# Patient Record
Sex: Male | Born: 1960 | Race: White | Hispanic: No | Smoking: Never smoker
Health system: Southern US, Community
[De-identification: ages and names within clinical notes are randomized; demographics above are authoritative.]

## PROBLEM LIST (undated history)

## (undated) DIAGNOSIS — K859 Acute pancreatitis without necrosis or infection, unspecified: Secondary | ICD-10-CM

## (undated) DIAGNOSIS — E119 Type 2 diabetes mellitus without complications: Secondary | ICD-10-CM

## (undated) DIAGNOSIS — I10 Essential (primary) hypertension: Secondary | ICD-10-CM

---

## 2014-06-25 ENCOUNTER — Inpatient Hospital Stay (HOSPITAL_COMMUNITY)
Admission: EM | Admit: 2014-06-25 | Discharge: 2014-07-01 | DRG: 442 | Disposition: A | Discharge status: Home / Self Care | Payer: BLUE CROSS/BLUE SHIELD | Attending: Family Medicine | Admitting: Family Medicine

## 2014-06-25 ENCOUNTER — Encounter (HOSPITAL_COMMUNITY): Payer: Self-pay | Admitting: *Deleted

## 2014-06-25 DIAGNOSIS — K7010 Alcoholic hepatitis without ascites: Secondary | ICD-10-CM | POA: Diagnosis present

## 2014-06-25 DIAGNOSIS — K7290 Hepatic failure, unspecified without coma: Principal | ICD-10-CM | POA: Diagnosis present

## 2014-06-25 DIAGNOSIS — R279 Unspecified lack of coordination: Secondary | ICD-10-CM

## 2014-06-25 DIAGNOSIS — R7401 Elevation of levels of liver transaminase levels: Secondary | ICD-10-CM

## 2014-06-25 DIAGNOSIS — R740 Nonspecific elevation of levels of transaminase and lactic acid dehydrogenase [LDH]: Secondary | ICD-10-CM | POA: Diagnosis present

## 2014-06-25 DIAGNOSIS — E876 Hypokalemia: Secondary | ICD-10-CM | POA: Diagnosis present

## 2014-06-25 DIAGNOSIS — K7682 Hepatic encephalopathy: Secondary | ICD-10-CM | POA: Diagnosis present

## 2014-06-25 DIAGNOSIS — Z8719 Personal history of other diseases of the digestive system: Secondary | ICD-10-CM | POA: Diagnosis not present

## 2014-06-25 DIAGNOSIS — R270 Ataxia, unspecified: Secondary | ICD-10-CM

## 2014-06-25 DIAGNOSIS — F1010 Alcohol abuse, uncomplicated: Secondary | ICD-10-CM | POA: Diagnosis not present

## 2014-06-25 DIAGNOSIS — E119 Type 2 diabetes mellitus without complications: Secondary | ICD-10-CM | POA: Diagnosis present

## 2014-06-25 DIAGNOSIS — F10231 Alcohol dependence with withdrawal delirium: Secondary | ICD-10-CM | POA: Diagnosis present

## 2014-06-25 DIAGNOSIS — D696 Thrombocytopenia, unspecified: Secondary | ICD-10-CM | POA: Diagnosis present

## 2014-06-25 DIAGNOSIS — K709 Alcoholic liver disease, unspecified: Secondary | ICD-10-CM

## 2014-06-25 DIAGNOSIS — R251 Tremor, unspecified: Secondary | ICD-10-CM

## 2014-06-25 DIAGNOSIS — R7989 Other specified abnormal findings of blood chemistry: Secondary | ICD-10-CM

## 2014-06-25 DIAGNOSIS — F10239 Alcohol dependence with withdrawal, unspecified: Secondary | ICD-10-CM

## 2014-06-25 DIAGNOSIS — R945 Abnormal results of liver function studies: Secondary | ICD-10-CM

## 2014-06-25 DIAGNOSIS — R42 Dizziness and giddiness: Secondary | ICD-10-CM | POA: Diagnosis present

## 2014-06-25 DIAGNOSIS — F10939 Alcohol use, unspecified with withdrawal, unspecified: Secondary | ICD-10-CM

## 2014-06-25 HISTORY — DX: Acute pancreatitis without necrosis or infection, unspecified: K85.90

## 2014-06-25 HISTORY — DX: Essential (primary) hypertension: I10

## 2014-06-25 HISTORY — DX: Type 2 diabetes mellitus without complications: E11.9

## 2014-06-25 LAB — COMPREHENSIVE METABOLIC PANEL
ALK PHOS: 289 U/L — AB (ref 39–117)
ALT: 157 U/L — ABNORMAL HIGH (ref 0–53)
ANION GAP: 11 (ref 5–15)
AST: 484 U/L — ABNORMAL HIGH (ref 0–37)
Albumin: 3.9 g/dL (ref 3.5–5.2)
BUN: 7 mg/dL (ref 6–23)
CALCIUM: 8.3 mg/dL — AB (ref 8.4–10.5)
CO2: 23 mmol/L (ref 19–32)
Chloride: 102 mmol/L (ref 96–112)
Creatinine, Ser: 0.57 mg/dL (ref 0.50–1.35)
GFR calc non Af Amer: >90 mL/min (ref >90)
Glucose, Bld: 119 mg/dL — ABNORMAL HIGH (ref 70–99)
POTASSIUM: 3.6 mmol/L (ref 3.5–5.1)
SODIUM: 136 mmol/L (ref 135–145)
Total Bilirubin: 4.1 mg/dL — ABNORMAL HIGH (ref 0.3–1.2)
Total Protein: 7.2 g/dL (ref 6.0–8.3)

## 2014-06-25 LAB — CBC
HEMATOCRIT: 38.7 % — AB (ref 39.0–52.0)
Hemoglobin: 12.4 g/dL — ABNORMAL LOW (ref 13.0–17.0)
MCH: 29 pg (ref 26.0–34.0)
MCHC: 32 g/dL (ref 30.0–36.0)
MCV: 90.6 fL (ref 78.0–100.0)
PLATELETS: 60 10*3/uL — AB (ref 150–400)
RBC: 4.27 MIL/uL (ref 4.22–5.81)
RDW: 20.2 % — ABNORMAL HIGH (ref 11.5–15.5)
WBC: 4.2 10*3/uL (ref 4.0–10.5)

## 2014-06-25 LAB — ETHANOL: Alcohol, Ethyl (B): 17 mg/dL — ABNORMAL HIGH (ref 0–9)

## 2014-06-25 LAB — GLUCOSE, CAPILLARY
GLUCOSE-CAPILLARY: 133 mg/dL — AB (ref 70–99)
Glucose-Capillary: 146 mg/dL — ABNORMAL HIGH (ref 70–99)

## 2014-06-25 LAB — AMMONIA: Ammonia: 76 umol/L — ABNORMAL HIGH (ref 11–32)

## 2014-06-25 LAB — RAPID URINE DRUG SCREEN, HOSP PERFORMED
Amphetamines: NOT DETECTED
BENZODIAZEPINES: POSITIVE — AB
Barbiturates: NOT DETECTED
Cocaine: NOT DETECTED
Opiates: NOT DETECTED
Tetrahydrocannabinol: NOT DETECTED

## 2014-06-25 MED ORDER — SODIUM CHLORIDE 0.9 % IV BOLUS (SEPSIS)
1000.0000 mL | Freq: Once | INTRAVENOUS | Status: AC
  Administered 2014-06-25: 1000 mL via INTRAVENOUS

## 2014-06-25 MED ORDER — LORAZEPAM 2 MG/ML IJ SOLN
1.0000 mg | Freq: Once | INTRAMUSCULAR | Status: AC
  Administered 2014-06-25: 1 mg via INTRAVENOUS
  Filled 2014-06-25: qty 1

## 2014-06-25 MED ORDER — INSULIN GLARGINE 100 UNIT/ML ~~LOC~~ SOLN
16.0000 [IU] | Freq: Every day | SUBCUTANEOUS | Status: DC
  Administered 2014-06-26 – 2014-07-01 (×6): 16 [IU] via SUBCUTANEOUS
  Filled 2014-06-25 (×6): qty 0.16

## 2014-06-25 MED ORDER — LORAZEPAM 1 MG PO TABS
0.0000 mg | ORAL_TABLET | Freq: Two times a day (BID) | ORAL | Status: DC
  Filled 2014-06-25: qty 2

## 2014-06-25 MED ORDER — RIFAXIMIN 550 MG PO TABS
550.0000 mg | ORAL_TABLET | Freq: Two times a day (BID) | ORAL | Status: DC
  Administered 2014-06-25 – 2014-07-01 (×12): 550 mg via ORAL
  Filled 2014-06-25 (×13): qty 1

## 2014-06-25 MED ORDER — LORAZEPAM 2 MG/ML IJ SOLN: 0.0000 mg | Freq: Four times a day (QID) | INTRAMUSCULAR | Status: DC

## 2014-06-25 MED ORDER — LORAZEPAM 1 MG PO TABS
1.0000 mg | ORAL_TABLET | Freq: Four times a day (QID) | ORAL | Status: AC | PRN
  Administered 2014-06-25 – 2014-06-26 (×3): 1 mg via ORAL
  Filled 2014-06-25 (×6): qty 1

## 2014-06-25 MED ORDER — LORAZEPAM 2 MG/ML IJ SOLN
1.0000 mg | Freq: Four times a day (QID) | INTRAMUSCULAR | Status: AC | PRN
  Administered 2014-06-27: 1 mg via INTRAVENOUS
  Filled 2014-06-25: qty 1

## 2014-06-25 MED ORDER — FLUOXETINE HCL 20 MG PO CAPS
20.0000 mg | ORAL_CAPSULE | Freq: Every day | ORAL | Status: DC
  Administered 2014-06-25 – 2014-07-01 (×7): 20 mg via ORAL
  Filled 2014-06-25 (×7): qty 1

## 2014-06-25 MED ORDER — ACETAMINOPHEN 650 MG RE SUPP: 650.0000 mg | Freq: Four times a day (QID) | RECTAL | Status: DC | PRN

## 2014-06-25 MED ORDER — METOPROLOL SUCCINATE ER 50 MG PO TB24
50.0000 mg | ORAL_TABLET | Freq: Every day | ORAL | Status: DC
  Administered 2014-06-25 – 2014-07-01 (×7): 50 mg via ORAL
  Filled 2014-06-25 (×7): qty 1

## 2014-06-25 MED ORDER — ADULT MULTIVITAMIN W/MINERALS CH
1.0000 | ORAL_TABLET | Freq: Every day | ORAL | Status: DC
  Administered 2014-06-25 – 2014-07-01 (×6): 1 via ORAL
  Filled 2014-06-25 (×7): qty 1

## 2014-06-25 MED ORDER — ONDANSETRON HCL 4 MG/2ML IJ SOLN: 4.0000 mg | Freq: Four times a day (QID) | INTRAMUSCULAR | Status: DC | PRN

## 2014-06-25 MED ORDER — LACTULOSE 10 GM/15ML PO SOLN
30.0000 g | Freq: Once | ORAL | Status: AC
  Administered 2014-06-25: 30 g via ORAL
  Filled 2014-06-25: qty 45

## 2014-06-25 MED ORDER — SODIUM CHLORIDE 0.9 % IV SOLN
INTRAVENOUS | Status: DC
  Administered 2014-06-25: 19:00:00 via INTRAVENOUS

## 2014-06-25 MED ORDER — LORAZEPAM 2 MG/ML IJ SOLN
2.0000 mg | Freq: Once | INTRAMUSCULAR | Status: AC
  Administered 2014-06-25: 2 mg via INTRAVENOUS
  Filled 2014-06-25: qty 1

## 2014-06-25 MED ORDER — PANCRELIPASE (LIP-PROT-AMYL) 12000-38000 UNITS PO CPEP
24000.0000 [IU] | ORAL_CAPSULE | Freq: Three times a day (TID) | ORAL | Status: DC
  Administered 2014-06-25 – 2014-07-01 (×16): 24000 [IU] via ORAL
  Filled 2014-06-25 (×20): qty 2

## 2014-06-25 MED ORDER — VITAMIN B-1 100 MG PO TABS
100.0000 mg | ORAL_TABLET | Freq: Every day | ORAL | Status: DC
  Administered 2014-06-25: 100 mg via ORAL
  Filled 2014-06-25: qty 1

## 2014-06-25 MED ORDER — PANTOPRAZOLE SODIUM 40 MG PO TBEC
40.0000 mg | DELAYED_RELEASE_TABLET | Freq: Two times a day (BID) | ORAL | Status: DC
  Administered 2014-06-25 – 2014-07-01 (×12): 40 mg via ORAL
  Filled 2014-06-25 (×13): qty 1

## 2014-06-25 MED ORDER — INSULIN GLARGINE 100 UNIT/ML SOLOSTAR PEN: 16.0000 [IU] | PEN_INJECTOR | Freq: Every day | SUBCUTANEOUS | Status: DC

## 2014-06-25 MED ORDER — FOLIC ACID 1 MG PO TABS
1.0000 mg | ORAL_TABLET | Freq: Every day | ORAL | Status: DC
  Administered 2014-06-25 – 2014-07-01 (×7): 1 mg via ORAL
  Filled 2014-06-25 (×7): qty 1

## 2014-06-25 MED ORDER — VITAMIN B-1 100 MG PO TABS
100.0000 mg | ORAL_TABLET | Freq: Every day | ORAL | Status: DC
  Administered 2014-06-25 – 2014-06-30 (×6): 100 mg via ORAL
  Filled 2014-06-25 (×7): qty 1

## 2014-06-25 MED ORDER — GABAPENTIN 300 MG PO CAPS
300.0000 mg | ORAL_CAPSULE | Freq: Every day | ORAL | Status: DC
  Administered 2014-06-25 – 2014-06-30 (×6): 300 mg via ORAL
  Filled 2014-06-25 (×7): qty 1

## 2014-06-25 MED ORDER — LORAZEPAM 1 MG PO TABS
0.0000 mg | ORAL_TABLET | Freq: Four times a day (QID) | ORAL | Status: DC
  Administered 2014-06-25: 2 mg via ORAL

## 2014-06-25 MED ORDER — ONDANSETRON HCL 4 MG PO TABS: 4.0000 mg | ORAL_TABLET | Freq: Four times a day (QID) | ORAL | Status: DC | PRN

## 2014-06-25 MED ORDER — ENOXAPARIN SODIUM 40 MG/0.4ML ~~LOC~~ SOLN
40.0000 mg | SUBCUTANEOUS | Status: DC
  Administered 2014-06-25 – 2014-06-26 (×2): 40 mg via SUBCUTANEOUS
  Filled 2014-06-25 (×3): qty 0.4

## 2014-06-25 MED ORDER — LORAZEPAM 2 MG/ML IJ SOLN: 0.0000 mg | Freq: Two times a day (BID) | INTRAMUSCULAR | Status: DC

## 2014-06-25 MED ORDER — LACTULOSE 10 GM/15ML PO SOLN
20.0000 g | Freq: Three times a day (TID) | ORAL | Status: DC
  Administered 2014-06-25 (×2): 20 g via ORAL
  Filled 2014-06-25 (×5): qty 30

## 2014-06-25 MED ORDER — INSULIN ASPART 100 UNIT/ML ~~LOC~~ SOLN
0.0000 [IU] | Freq: Three times a day (TID) | SUBCUTANEOUS | Status: DC
  Administered 2014-06-25 – 2014-06-26 (×2): 1 [IU] via SUBCUTANEOUS
  Administered 2014-06-27: 2 [IU] via SUBCUTANEOUS
  Administered 2014-06-27 – 2014-06-28 (×3): 1 [IU] via SUBCUTANEOUS
  Administered 2014-06-28: 3 [IU] via SUBCUTANEOUS
  Administered 2014-06-29 (×2): 2 [IU] via SUBCUTANEOUS
  Administered 2014-06-29: 3 [IU] via SUBCUTANEOUS
  Administered 2014-06-30 – 2014-07-01 (×4): 2 [IU] via SUBCUTANEOUS

## 2014-06-25 MED ORDER — THIAMINE HCL 100 MG/ML IJ SOLN
100.0000 mg | Freq: Every day | INTRAMUSCULAR | Status: DC
  Administered 2014-07-01: 100 mg via INTRAVENOUS
  Filled 2014-06-25 (×7): qty 1

## 2014-06-25 MED ORDER — ACETAMINOPHEN 325 MG PO TABS
650.0000 mg | ORAL_TABLET | Freq: Four times a day (QID) | ORAL | Status: DC | PRN
Start: 2014-06-25 — End: 2014-07-01

## 2014-06-25 NOTE — ED Notes (Signed)
Awake. Verbally responsive. A/O x4. Resp even and unlabored. No audible adventitious breath sounds noted. ABC's intact. Pt continues to have uncontrollable shaking of ext. Pt attempted to ambulate to BR with Dr. Denton LankSteinl but gait was very unsteady.

## 2014-06-25 NOTE — ED Notes (Signed)
Patient is unsteady, 2 person assist.

## 2014-06-25 NOTE — ED Notes (Signed)
Patient is unable to ambulate at this time, I will check on him in a few to try again

## 2014-06-25 NOTE — ED Notes (Signed)
Awake. Verbally responsive. A/O x4. Resp even and unlabored. No audible adventitious breath sounds noted. ABC's intact. IV patent and intact. Pt continues to have uncontrollable shaking of ext and trembling speech. 

## 2014-06-25 NOTE — ED Notes (Signed)
Patient given urinal.

## 2014-06-25 NOTE — ED Notes (Signed)
Pt unable to ambulate d/t unsteady gait. Pt transferred x2 assisted and in wc to BR.

## 2014-06-25 NOTE — ED Notes (Addendum)
Awake. Verbally responsive. A/O x4. Resp even and unlabored. No audible adventitious breath sounds noted. ABC's intact. Denies pain. Pt reported detoxing from alcohol. Last intake of wine was yesterday. Pt noted having visual tremors and upper ext and trembling in voice. Pt sent from [facility].

## 2014-06-25 NOTE — ED Notes (Signed)
Per EMS - patient comes from [facility] with believed DT's from ETOH.  Patient re-entered [facility] last night following a three week episode of binge drinking (patient left [facility] 3 weeks ago after completing a 28 day treatment plan).  Patient was stable on arrival yesterday, but this morning he woke up and had tremors.  [facility] sent him here for evaluation. Vitals on scene, 130/90, HR 98, RR 18 unlabored.  CBG 133.  Stroke screen negative.  Pt is type I diabetic and has not had his insulin today.

## 2014-06-25 NOTE — H&P (Signed)
PCP:   Pcp Not In System   Chief Complaint:   Dizziness,unsteady gait  HPI: 54 year old male with a history of diabetes mellitus, pancreatics, long-standing alcohol abuse who was [facility]. This morning patient was very shaky unable to stand became very dizzy , had tremors. Patient was sent to the ED for further evaluation. Patient denies history of seizures or DTs in the past. He has been in the same rehabilitation facility in February, but patient says that he relapsed so he came this time for a 60 day program. He denies any chest pain, complains of intermittent headache, no shortness of breath. No nausea vomiting or diarrhea. [facility] called and told that patient's ammonia level was more than 200. ED physician has already ordered ammonia level in the ED and a dose of lactulose has been given. Patient also found to have elevated liver enzymes.  Allergies:  No Known Allergies    Past Medical History  Diagnosis Date  . Diabetes mellitus     Type 1  . Pancreatitis     History reviewed. No pertinent past surgical history.  Prior to Admission medications   Medication Sig Start Date End Date Taking? Authorizing Provider  carbamazepine (TEGRETOL) 200 MG tablet Take 200 mg by mouth 2 (two) times daily.   Yes Historical Provider, MD  CREON 24000 UNITS CPEP Take 2 capsules by mouth 3 (three) times daily. 03/25/14  Yes Historical Provider, MD  Diazepam 10 MG/2ML SOAJ Inject 2 mLs into the muscle stat. For seizures. Then call medical staff   Yes Historical Provider, MD  FLUoxetine (PROZAC) 20 MG capsule Take 20 mg by mouth daily. 05/12/14  Yes Historical Provider, MD  gabapentin (NEURONTIN) 300 MG capsule Take 300 mg by mouth at bedtime.   Yes Historical Provider, MD  lactulose (CHRONULAC) 10 GM/15ML solution Take 10 g by mouth 2 (two) times daily.   Yes Historical Provider, MD  LANTUS SOLOSTAR 100 UNIT/ML Solostar  Pen Inject 16 Units into the skin daily with breakfast.  05/05/14  Yes Historical Provider, MD  LORazepam (ATIVAN) 2 MG tablet Take 2 mg by mouth every 6 (six) hours as needed for anxiety. For 4 doses then stop at start 1.5 mg for 4 doses then stop and start 1 mg for 4 doses then stop and start 0.5 mg for 4 doses then stop   Yes Historical Provider, MD  metoprolol succinate (TOPROL-XL) 50 MG 24 hr tablet Take 50 mg by mouth daily. Take with or immediately following a meal.   Yes Historical Provider, MD  Multiple Vitamin (MULTIVITAMIN WITH MINERALS) TABS tablet Take 1 tablet by mouth daily.   Yes Historical Provider, MD  NOVOLOG FLEXPEN 100 UNIT/ML FlexPen Inject 8 Units into the skin 3 (three) times daily. 06/06/14  Yes Historical Provider, MD  pantoprazole (PROTONIX) 40 MG tablet Take 40 mg by mouth 2 (two) times daily. 06/06/14  Yes Historical Provider, MD  thiamine 100 MG tablet Take 100 mg by mouth daily.   Yes Historical Provider, MD  traZODone (DESYREL) 50 MG tablet Take 50 mg by mouth at bedtime. As needed for insomnia   Yes Historical Provider, MD  XIFAXAN 550 MG TABS tablet Take 550 mg by mouth 2 (two) times daily. 04/04/14  Yes Historical Provider, MD  carbamazepine (TEGRETOL) 100 MG chewable tablet Chew 100 mg by mouth 2 (two) times daily. For 3 days    Historical Provider, MD    Social History:  reports that he drinks alcohol. His tobacco and drug histories are not on file.  Family history- unknown at this time as patient was adopted  All the positives are listed in BOLD  Review of Systems:  HEENT: Headache, blurred vision, runny nose, sore throat Neck: Hypothyroidism, hyperthyroidism,,lymphadenopathy Chest : Shortness of breath, history of COPD, Asthma Heart : Chest pain, history of coronary arterey disease GI:  Nausea, vomiting, diarrhea, constipation, GERD GU: Dysuria, urgency, frequency of urination, hematuria Neuro: Stroke, seizures, syncope Psych: Depression, anxiety,  hallucinations   Physical Exam: Blood pressure 139/91, pulse 92, temperature 98 F (36.7 C), resp. rate 20, SpO2 94 %. Constitutional:   Patient is a well-developed and well-nourished *male with tremors in the upper extremities and cooperative with exam. Head: Normocephalic and atraumatic Mouth: Mucus membranes moist Eyes: PERRL, EOMI, conjunctivae normal Neck: Supple, No Thyromegaly Cardiovascular: RRR, S1 normal, S2 normal Pulmonary/Chest: CTAB, no wheezes, rales, or rhonchi Abdominal: Soft. Non-tender, non-distended, bowel sounds are normal, no masses, organomegaly, or guarding present.  Neurological: A&O x3, Strength is normal and symmetric bilaterally, cranial nerve II-XII are grossly intact, no focal motor deficit, sensory intact to light touch bilaterally.  Extremities positive tremors of the upper extremities  Labs on Admission:  Basic Metabolic Panel:  Recent Labs Lab 06/25/14 0837  NA 136  K 3.6  CL 102  CO2 23  GLUCOSE 119*  BUN 7  CREATININE 0.57  CALCIUM 8.3*   Liver Function Tests:  Recent Labs Lab 06/25/14 0837  AST 484*  ALT 157*  ALKPHOS 289*  BILITOT 4.1*  PROT 7.2  ALBUMIN 3.9   No results for input(s): LIPASE, AMYLASE in the last 168 hours. No results for input(s): AMMONIA in the last 168 hours. CBC:  Recent Labs Lab 06/25/14 0837  WBC 4.2  HGB 12.4*  HCT 38.7*  MCV 90.6  PLT 60*     Assessment/Plan Active Problems:   Ataxia   Alcohol abuse   Hepatic encephalopathy  Hepatic encephalopathy Patient presenting with elevated liver enzymes, elevated levels of ammonia as per report [facility]. We'll start the patient on lactulose 20 g 4 times a day also continue with rifaximin 550 mg by mouth twice a day. Will repeat ammonia level in a.m.  Alcohol withdrawal Patient is going into alcohol withdrawal, start CIWA protocol.  Will start thiamine and folate IV fluids with normal saline at 75 mL per hour  Transaminitis Likely  from alcoholic hepatitis Will repeat CMP in a.m. Patient had liver biopsy 3 years ago which was negative for liver cirrhosis  Diabetes mellitus Continue Lantus, will also start sliding scale insulin with NovoLog  History of pancreatitis Continue pancreatic enzymes 3 times a day  DVT prophylaxis Lovenox  Patient's med reconciliation listed carbamazepine as one of the medications, I confirmed with the patient he is not taking carbamazepine. He does not have history of seizures.  Code status: Full code  Family discussion: No family present at bedside   Time Spent on Admission: 60 minutes  Dariyon Urquilla S [facility] Pager: [phone] 06/25/2014, 2:52 PM  If 7PM-7AM, please contact night-coverage  [email or web address]  Password [email or web address]

## 2014-06-25 NOTE — ED Notes (Signed)
Awake. Verbally responsive. A/O x4. Resp even and unlabored. No audible adventitious breath sounds noted. ABC's intact. IV patent and intact. Pt continues to have uncontrollable shaking of ext and trembling speech.

## 2014-06-25 NOTE — ED Notes (Signed)
Awake. Verbally responsive. A/O x4. Resp even and unlabored. No audible adventitious breath sounds noted. ABC's intact.  

## 2014-06-25 NOTE — ED Notes (Signed)
RN Marylee FlorasBlanche starting IV

## 2014-06-25 NOTE — ED Notes (Signed)
Bed: WA07 Expected date:  Expected time:  Means of arrival:  Comments: EMS 49M DT's

## 2014-06-25 NOTE — ED Provider Notes (Addendum)
CSN: [id number]     Arrival date & time 06/25/14  [id number] History   First MD Initiated Contact with Patient 06/25/14 [phone]     Chief Complaint  Patient presents with  . Delirium Tremens (DTS)     (Consider location/radiation/quality/duration/timing/severity/associated sxs/prior Treatment) The history is provided by the patient.  Patient with hx chronic etoh abuse, presents from rehab program/[name], where pt was noted to be tremulous and shaky this morning.  Pt states he last drank etoh yesterday AM. Pt indicates when he stops drinking he frequently will feel shaky. Pt denies hx recurrent seizures or hx DTs. Pt indicates he is from [place] area and that he completed [facility] day program 3 weeks ago.  Pt indicates since d/c from that program, he has been drinking every day. Pt denies depression or thoughts of self harm. Denies other substance abuse. No fever or chills. Normal appetite, although states that he hasnt eaten anything yet today.    Past Medical History  Diagnosis Date  . Diabetes mellitus     Type 1  . Pancreatitis    History reviewed. No pertinent past surgical history. No family history on file. History  Substance Use Topics  . Smoking status: Not on file  . Smokeless tobacco: Not on file  . Alcohol Use: Yes    Review of Systems  Constitutional: Negative for fever and chills.  HENT: Negative for sore throat.   Eyes: Negative for visual disturbance.  Respiratory: Negative for cough and shortness of breath.   Cardiovascular: Negative for chest pain.  Gastrointestinal: Negative for vomiting, abdominal pain and diarrhea.  Genitourinary: Negative for flank pain.  Musculoskeletal: Negative for back pain and neck pain.  Skin: Negative for rash.  Neurological: Negative for seizures, weakness, numbness and headaches.  Hematological: Does not bruise/bleed easily.  Psychiatric/Behavioral: Negative for confusion.      Allergies  Review of patient's  allergies indicates no known allergies.  Home Medications   Prior to Admission medications   Not on File   BP 135/90 mmHg  Pulse 100  Temp(Src) 98 F (36.7 C)  Resp 20  SpO2 93% Physical Exam  Constitutional: He is oriented to person, place, and time. He appears well-developed and well-nourished. No distress.  HENT:  Head: Atraumatic.  Mouth/Throat: Oropharynx is clear and moist.  Eyes: Conjunctivae are normal. Pupils are equal, round, and reactive to light. No scleral icterus.  Neck: Neck supple. No tracheal deviation present.  Cardiovascular: Normal rate, regular rhythm, normal heart sounds and intact distal pulses.   Pulmonary/Chest: Effort normal and breath sounds normal. No accessory muscle usage. No respiratory distress.  Abdominal: Soft. Bowel sounds are normal. He exhibits no distension. There is no tenderness.  Musculoskeletal: Normal range of motion. He exhibits no edema.  Neurological: He is alert and oriented to person, place, and time.  Patient is shaky/tremulous appearing. ?asterixis.   Skin: Skin is warm and dry. No rash noted. He is not diaphoretic.  Psychiatric: He has a normal mood and affect.  Nursing note and vitals reviewed.   ED Course  Procedures (including critical care time) Labs Review   Results for orders placed or performed during the hospital encounter of 06/25/14  Urine rapid drug screen (hosp performed)  Result Value Ref Range   Opiates NONE DETECTED NONE DETECTED   Cocaine NONE DETECTED NONE DETECTED   Benzodiazepines POSITIVE (A) NONE DETECTED   Amphetamines NONE DETECTED NONE DETECTED   Tetrahydrocannabinol NONE DETECTED NONE DETECTED   Barbiturates  NONE DETECTED NONE DETECTED  Ethanol  Result Value Ref Range   Alcohol, Ethyl (B) 17 (H) 0 - 9 mg/dL  CBC  Result Value Ref Range   WBC 4.2 4.0 - 10.5 K/uL   RBC 4.27 4.22 - 5.81 MIL/uL   Hemoglobin 12.4 (L) 13.0 - 17.0 g/dL   HCT 16.1 (L) 09.6 - 04.5 %   MCV 90.6 78.0 - 100.0 fL    MCH 29.0 26.0 - 34.0 pg   MCHC 32.0 30.0 - 36.0 g/dL   RDW 40.9 (H) 81.1 - 91.4 %   Platelets 60 (L) 150 - 400 K/uL  Comprehensive metabolic panel  Result Value Ref Range   Sodium 136 135 - 145 mmol/L   Potassium 3.6 3.5 - 5.1 mmol/L   Chloride 102 96 - 112 mmol/L   CO2 23 19 - 32 mmol/L   Glucose, Bld 119 (H) 70 - 99 mg/dL   BUN 7 6 - 23 mg/dL   Creatinine, Ser 7.82 0.50 - 1.35 mg/dL   Calcium 8.3 (L) 8.4 - 10.5 mg/dL   Total Protein 7.2 6.0 - 8.3 g/dL   Albumin 3.9 3.5 - 5.2 g/dL   AST 956 (H) 0 - 37 U/L   ALT 157 (H) 0 - 53 U/L   Alkaline Phosphatase 289 (H) 39 - 117 U/L   Total Bilirubin 4.1 (H) 0.3 - 1.2 mg/dL   GFR calc non Af Amer >90 >90 mL/min   GFR calc Af Amer >90 >90 mL/min   Anion gap 11 5 - 15      MDM   Iv ns bolus. Ativan 1 mg iv. Labs.  Reviewed nursing notes and prior charts for additional history.   Pt is tremulous, however exhibits no sign of DTS, and has no hx same. Vital signs are normal, and sensorium is clear - neither c/w DTs.  Pt states he commonly will feel shaky if/when he hasnt had a drink for a period of time.    Food tray/po fluids.  Recheck pt appears comfortable. No distress. Less shaky.  No delirium or altered mental status.  No tachycardia. No diaphoresis.   Pt is not able to ambulate due to extreme shakiness when attempts to stand.   ciwa protocol.  [facility] called, they indicate they just got lab results back from this morning there, and that ammonia level 200+.  Will check ammonia level here. Lactulose po.  Medical service contacted for admission.      Cathren Laine, MD 06/25/14 [phone]

## 2014-06-25 NOTE — ED Notes (Signed)
Awake. Verbally responsive. A/O x4. Resp even and unlabored. No audible adventitious breath sounds noted. ABC's intact. Pt continues to have tremors to bil upper ext. IV infusing NS at 15125ml/hr.

## 2014-06-25 NOTE — ED Notes (Addendum)
Received call from Gomez Cleverlyindy Harrell, [facility] at [facility] [phone]) with report of Ammonia level of 276. Dr. Arizona ConstableStienl made aware with new order noted.

## 2014-06-25 NOTE — ED Notes (Signed)
Awake. Verbally responsive. A/O x4. Resp even and unlabored. No audible adventitious breath sounds noted. ABC's intact. No changes noted to ext shakiness. IV saline lock patent and intact.

## 2014-06-26 LAB — CBC
HCT: 39.4 % (ref 39.0–52.0)
Hemoglobin: 12.5 g/dL — ABNORMAL LOW (ref 13.0–17.0)
MCH: 29.3 pg (ref 26.0–34.0)
MCHC: 31.7 g/dL (ref 30.0–36.0)
MCV: 92.3 fL (ref 78.0–100.0)
Platelets: 53 10*3/uL — ABNORMAL LOW (ref 150–400)
RBC: 4.27 MIL/uL (ref 4.22–5.81)
RDW: 20 % — AB (ref 11.5–15.5)
WBC: 4 10*3/uL (ref 4.0–10.5)

## 2014-06-26 LAB — GLUCOSE, CAPILLARY
GLUCOSE-CAPILLARY: 117 mg/dL — AB (ref 70–99)
GLUCOSE-CAPILLARY: 76 mg/dL (ref 70–99)
Glucose-Capillary: 143 mg/dL — ABNORMAL HIGH (ref 70–99)
Glucose-Capillary: 94 mg/dL (ref 70–99)

## 2014-06-26 LAB — COMPREHENSIVE METABOLIC PANEL
ALK PHOS: 295 U/L — AB (ref 39–117)
ALT: 139 U/L — AB (ref 0–53)
AST: 424 U/L — ABNORMAL HIGH (ref 0–37)
Albumin: 3.8 g/dL (ref 3.5–5.2)
Anion gap: 14 (ref 5–15)
BUN: <5 mg/dL — ABNORMAL LOW (ref 6–23)
CHLORIDE: 101 mmol/L (ref 96–112)
CO2: 23 mmol/L (ref 19–32)
Calcium: 8.6 mg/dL (ref 8.4–10.5)
Creatinine, Ser: 0.56 mg/dL (ref 0.50–1.35)
GFR calc non Af Amer: >90 mL/min (ref >90)
GLUCOSE: 126 mg/dL — AB (ref 70–99)
Potassium: 3.2 mmol/L — ABNORMAL LOW (ref 3.5–5.1)
Sodium: 138 mmol/L (ref 135–145)
TOTAL PROTEIN: 6.9 g/dL (ref 6.0–8.3)
Total Bilirubin: 5.8 mg/dL — ABNORMAL HIGH (ref 0.3–1.2)

## 2014-06-26 LAB — AMMONIA: AMMONIA: 115 umol/L — AB (ref 11–32)

## 2014-06-26 MED ORDER — LORAZEPAM 1 MG PO TABS
0.0000 mg | ORAL_TABLET | Freq: Two times a day (BID) | ORAL | Status: DC
  Administered 2014-06-28 – 2014-06-29 (×2): 1 mg via ORAL
  Filled 2014-06-26 (×2): qty 1

## 2014-06-26 MED ORDER — POTASSIUM CHLORIDE IN NACL 20-0.9 MEQ/L-% IV SOLN
INTRAVENOUS | Status: DC
  Administered 2014-06-26 – 2014-06-29 (×4): via INTRAVENOUS
  Filled 2014-06-26 (×6): qty 1000

## 2014-06-26 MED ORDER — SODIUM CHLORIDE 0.9 % IV SOLN: INTRAVENOUS | Status: DC

## 2014-06-26 MED ORDER — LACTULOSE 10 GM/15ML PO SOLN
20.0000 g | Freq: Four times a day (QID) | ORAL | Status: DC
  Administered 2014-06-26 – 2014-06-29 (×11): 20 g via ORAL
  Filled 2014-06-26 (×13): qty 30

## 2014-06-26 MED ORDER — LORAZEPAM 1 MG PO TABS
0.0000 mg | ORAL_TABLET | Freq: Four times a day (QID) | ORAL | Status: AC
  Administered 2014-06-26 – 2014-06-28 (×7): 1 mg via ORAL
  Filled 2014-06-26 (×6): qty 1

## 2014-06-26 MED ORDER — POTASSIUM CHLORIDE CRYS ER 20 MEQ PO TBCR
40.0000 meq | EXTENDED_RELEASE_TABLET | Freq: Once | ORAL | Status: AC
Start: 2014-06-26 — End: 2014-06-26
  Administered 2014-06-26: 40 meq via ORAL
  Filled 2014-06-26: qty 2

## 2014-06-26 NOTE — Progress Notes (Signed)
[facility] PROGRESS NOTE  Robert DartingRodney Colon ZOX:[id number] DOB: 08-17-1960 DOA: 06/25/2014 PCP: Pcp Not In System  Assessment/Plan:  Hepatic encephalopathy Patient presented with elevated liver enzymes, elevated levels of ammonia as per report from [facility]. We started the patient on lactulose 20 g 4 times a day also continue with rifaximin 550 mg by mouth twice a day. This morning ammonia level is 115. We will continue lactulose 20 g 4 times a day  Alcohol withdrawal Patient is going into alcohol withdrawal, start CIWA protocol.  Will start thiamine and folate IV fluids with normal saline at 75 ml/hr  Hypokalemia Replace potassium and check BMP in a.m.  Transaminitis Likely from alcoholic hepatitis Will repeat CMP in a.m. Patient had liver biopsy 3 years ago which was negative for liver cirrhosis  Diabetes mellitus Blood glucose is well-controlled Continue Lantus, will also start sliding scale insulin with NovoLog  History of pancreatitis Continue pancreatic enzymes 3 times a day  DVT prophylaxis Lovenox  Code Status: Full code Family Communication: No family at bedside Disposition Plan: Home when stable   Consultants:  None  Procedures:   None  Antibiotics:  None  HPI/Subjective: 54 year old male with a history of diabetes mellitus, pancreatics, long-standing alcohol abuse who was recently [facility]. This morning patient was very shaky unable to stand became very dizzy , had tremors. Patient was sent to the ED for further evaluation. Patient denies history of seizures or DTs in the past. He has been in the same rehabilitation facility in February, but patient says that he relapsed so he came this time for a 60 day program. He denies any chest pain, complains of intermittent headache, no shortness of breath. No nausea vomiting or diarrhea. [name] called and told that patient's  ammonia level was more than 200. ED physician has already ordered ammonia level in the ED and a dose of lactulose has been given. Patient also found to have elevated liver enzymes.  Objective: Filed Vitals:   06/26/14 0538  BP: 137/97  Pulse: 94  Temp: 98.2 F (36.8 C)  Resp: 20    Intake/Output Summary (Last 24 hours) at 06/26/14 1346 Last data filed at 06/26/14 0604  Gross per 24 hour  Intake 1469.8 ml  Output      5 ml  Net 1464.8 ml   There were no vitals filed for this visit.  Exam:   General:  Appear in no acute distress  Cardiovascular: S1s2 RRR  Respiratory: Clear bilaterally  Abdomen: Soft, nontender  Musculoskeletal:  No edema   Data Reviewed: Basic Metabolic Panel:  Recent Labs Lab 06/25/14 0837 06/26/14 0515  NA 136 138  K 3.6 3.2*  CL 102 101  CO2 23 23  GLUCOSE 119* 126*  BUN 7 <5*  CREATININE 0.57 0.56  CALCIUM 8.3* 8.6   Liver Function Tests:  Recent Labs Lab 06/25/14 0837 06/26/14 0515  AST 484* 424*  ALT 157* 139*  ALKPHOS 289* 295*  BILITOT 4.1* 5.8*  PROT 7.2 6.9  ALBUMIN 3.9 3.8   No results for input(s): LIPASE, AMYLASE in the last 168 hours.  Recent Labs Lab 06/25/14 1424 06/26/14 0646  AMMONIA 76* 115*   CBC:  Recent Labs Lab 06/25/14 0837 06/26/14 0515  WBC 4.2 4.0  HGB 12.4* 12.5*  HCT 38.7* 39.4  MCV 90.6 92.3  PLT 60* 53*   Cardiac Enzymes: No results for input(s): CKTOTAL, CKMB, CKMBINDEX, TROPONINI in the last 168 hours. BNP (  last 3 results) No results for input(s): BNP in the last 8760 hours.  ProBNP (last 3 results) No results for input(s): PROBNP in the last 8760 hours.  CBG:  Recent Labs Lab 06/25/14 1726 06/25/14 2123 06/26/14 0736 06/26/14 1148  GLUCAP 133* 146* 117* 143*    No results found for this or any previous visit (from the past 240 hour(s)).   Studies: No results found.  Scheduled Meds: . enoxaparin (LOVENOX) injection  40 mg Subcutaneous Q24H  . FLUoxetine  20 mg  Oral Daily  . folic acid  1 mg Oral Daily  . gabapentin  300 mg Oral QHS  . insulin aspart  0-9 Units Subcutaneous TID WC  . insulin glargine  16 Units Subcutaneous Daily  . lactulose  20 g Oral QID  . lipase/protease/amylase  24,000 Units Oral TID AC  . LORazepam  0-4 mg Oral Q6H   Followed by  . [START ON 06/28/2014] LORazepam  0-4 mg Oral Q12H  . metoprolol succinate  50 mg Oral Daily  . multivitamin with minerals  1 tablet Oral Daily  . pantoprazole  40 mg Oral BID  . potassium chloride  40 mEq Oral Once  . rifaximin  550 mg Oral BID  . thiamine  100 mg Oral Daily   Or  . thiamine  100 mg Intravenous Daily   Continuous Infusions:   Active Problems:   Ataxia   Alcohol abuse   Hepatic encephalopathy   Diabetes mellitus    Time spent: 25 min    [facility]  [facility] Pager [phone]. If 7PM-7AM, please contact night-coverage at [email or web address], password [facility] 06/26/2014, 1:46 PM  LOS: 1 day

## 2014-06-27 LAB — BASIC METABOLIC PANEL
Anion gap: 20 — ABNORMAL HIGH (ref 5–15)
BUN: 5 mg/dL — AB (ref 6–23)
CHLORIDE: 103 mmol/L (ref 96–112)
CO2: 16 mmol/L — ABNORMAL LOW (ref 19–32)
CREATININE: 0.39 mg/dL — AB (ref 0.50–1.35)
Calcium: 8.7 mg/dL (ref 8.4–10.5)
GFR calc Af Amer: >90 mL/min (ref >90)
GLUCOSE: 82 mg/dL (ref 70–99)
POTASSIUM: 4.1 mmol/L (ref 3.5–5.1)
Sodium: 139 mmol/L (ref 135–145)

## 2014-06-27 LAB — HEMOGLOBIN A1C
Hgb A1c MFr Bld: 8 % — ABNORMAL HIGH (ref 4.8–5.6)
Mean Plasma Glucose: 183 mg/dL

## 2014-06-27 LAB — GLUCOSE, CAPILLARY
Glucose-Capillary: 92 mg/dL (ref 70–99)
Glucose-Capillary: 156 mg/dL — ABNORMAL HIGH (ref 70–99)
Glucose-Capillary: 134 mg/dL — ABNORMAL HIGH (ref 70–99)
Glucose-Capillary: 188 mg/dL — ABNORMAL HIGH (ref 70–99)

## 2014-06-27 LAB — AMMONIA: Ammonia: 94 umol/L — ABNORMAL HIGH (ref 11–32)

## 2014-06-27 NOTE — Progress Notes (Signed)
[facility] PROGRESS NOTE  Robert Colon ZOX:[id number] DOB: [birth year] DOA: 06/25/2014 PCP: Pcp Not In System  Assessment/Plan:  Hepatic encephalopathy Patient presented with elevated liver enzymes, elevated levels of ammonia as per [facility]. We started the patient on lactulose 20 g 4 times a day also continue with rifaximin 550 mg by mouth twice a day. This morning ammonia level is 94 We will continue lactulose 20 g 4 times a day  Alcohol withdrawal Patient is going into alcohol withdrawal, start CIWA protocol.  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  Transaminitis Likely from alcoholic hepatitis Will repeat CMP in a.m. Patient had liver biopsy 3 years ago which was negative for liver cirrhosis Will check abdominal ultrasound in am  Diabetes mellitus Blood glucose is well-controlled Continue Lantus, will also start sliding scale insulin with NovoLog  History of pancreatitis Continue pancreatic enzymes 3 times a day  DVT prophylaxis SCD  Code Status: Full code Family Communication: No family at bedside Disposition Plan: Home when stable   Consultants:  None  Procedures:   None  Antibiotics:  None  HPI/Subjective: 54 year old male with a history of diabetes mellitus, pancreatics, long-standing alcohol abuse who [facility] This morning patient was very shaky unable to stand became very dizzy , had tremors. Patient was sent to the ED for further evaluation. Patient denies history of seizures or DTs in the past. He has been in the same rehabilitation facility in February, but patient says that he relapsed so he came this time for a 60 day program. He denies any chest pain, complains of intermittent headache, no  shortness of breath. No nausea vomiting or diarrhea. [name] and told that patient's ammonia level was more than 200. ED physician has already ordered ammonia level in the ED and a dose of lactulose has been given. This morning he feels better, but continues to have gait imbalance on walking.  Objective: Filed Vitals:   06/27/14 1343  BP: 106/71  Pulse: 91  Temp: 98.7 F (37.1 C)  Resp: 18    Intake/Output Summary (Last 24 hours) at 06/27/14 1618 Last data filed at 06/27/14 1434  Gross per 24 hour  Intake 2224.05 ml  Output    550 ml  Net 1674.05 ml   There were no vitals filed for this visit.  Exam:   General:  Appear in no acute distress  Cardiovascular: S1s2 RRR  Respiratory: Clear bilaterally  Abdomen: Soft, nontender  Musculoskeletal:  No edema   Data Reviewed: Basic Metabolic Panel:  Recent Labs Lab 06/25/14 0837 06/26/14 0515 06/27/14 0420  NA 136 138 139  K 3.6 3.2* 4.1  CL 102 101 103  CO2 23 23 16*  GLUCOSE 119* 126* 82  BUN 7 <5* 5*  CREATININE 0.57 0.56 0.39*  CALCIUM 8.3* 8.6 8.7   Liver Function Tests:  Recent Labs Lab 06/25/14 0837 06/26/14 0515  AST 484* 424*  ALT 157* 139*  ALKPHOS 289* 295*  BILITOT 4.1* 5.8*  PROT 7.2 6.9  ALBUMIN 3.9 3.8   No results for input(s): LIPASE, AMYLASE in the last 168 hours.  Recent Labs Lab 06/25/14 1424 06/26/14 0646 06/27/14 0920  AMMONIA 76* 115* 94*   CBC:  Recent Labs Lab 06/25/14 0837 06/26/14 0515  WBC 4.2 4.0  HGB  12.4* 12.5*  HCT 38.7* 39.4  MCV 90.6 92.3  PLT 60* 53*   Cardiac Enzymes: No results for input(s): CKTOTAL, CKMB, CKMBINDEX, TROPONINI in the last 168 hours. BNP (last 3 results) No results for input(s): BNP in the last 8760 hours.  ProBNP (last 3 results) No results for input(s): PROBNP in the last 8760 hours.  CBG:  Recent Labs Lab 06/26/14 1148 06/26/14 1651 06/26/14 2046 06/27/14 0757 06/27/14 1208  GLUCAP 143* 76 94 92 156*     No results found for this or any previous visit (from the past 240 hour(s)).   Studies: No results found.  Scheduled Meds: . enoxaparin (LOVENOX) injection  40 mg Subcutaneous Q24H  . FLUoxetine  20 mg Oral Daily  . folic acid  1 mg Oral Daily  . gabapentin  300 mg Oral QHS  . insulin aspart  0-9 Units Subcutaneous TID WC  . insulin glargine  16 Units Subcutaneous Daily  . lactulose  20 g Oral QID  . lipase/protease/amylase  24,000 Units Oral TID AC  . LORazepam  0-4 mg Oral Q6H   Followed by  . [START ON 06/28/2014] LORazepam  0-4 mg Oral Q12H  . metoprolol succinate  50 mg Oral Daily  . multivitamin with minerals  1 tablet Oral Daily  . pantoprazole  40 mg Oral BID  . rifaximin  550 mg Oral BID  . thiamine  100 mg Oral Daily   Or  . thiamine  100 mg Intravenous Daily   Continuous Infusions: . 0.9 % NaCl with KCl 20 mEq / L 75 mL/hr at 06/27/14 0240    Active Problems:   Ataxia   Alcohol abuse   Hepatic encephalopathy   Diabetes mellitus    Time spent: 25 min    [facility] S  [facility] Pager [phone]. If 7PM-7AM, please contact night-coverage at [email or web address], password [facility] 06/27/2014, 4:18 PM  LOS: 2 days

## 2014-06-27 NOTE — Progress Notes (Addendum)
CSW received notification that pt admitted from [place] and per MD, medically ready to return to [facility] today.  CSW met with pt at bedside. CSW introduced self and explained role. CSW reviewed consent to release information with pt and pt signed release.  CSW contacted [facility] and spoke with RN, Jenny Reichmann who reported that d/c summary and all progress notes would need to be faxed to facility for review. Per [facility] RN, Jenny Reichmann pt will need to be able to ambulate long distances and be able to provide all self care needs in order to return. CSW notified unit RN in order for RN to ambulate pt and place progress note in EPIC in order for CSW to send to [facility].  CSW faxed available clinicals to [facility] and awaiting note from RN about ambulation and d/c summary.   CSW to continue to follow.  Addendum:   CSW received return phone call from [facility], Jenny Reichmann stating that facilities PA reviewed pt information and felt that pt potassium is not yet stable. Per [facility] she also spoke with pt and pt speech was still slurred and pt did not feel well. [facility] states that they do not feel pt is stable to return to [facility] today.  CSW spoke with MD to notify and MD stated that pt potassium is being replaced and MD plans to get physical therapy to ensure that pt mobile and can tolerate doing all self care at [facility]. MD hopeful for d/c tomorrow.  CSW to continue to follow.  Alison Murray, MSW, LCSW Clinical Social Work [phone]  Alison Murray, MSW, [place] Work [phone]

## 2014-06-28 ENCOUNTER — Inpatient Hospital Stay (HOSPITAL_COMMUNITY): Payer: BLUE CROSS/BLUE SHIELD

## 2014-06-28 DIAGNOSIS — R7989 Other specified abnormal findings of blood chemistry: Secondary | ICD-10-CM

## 2014-06-28 DIAGNOSIS — F1010 Alcohol abuse, uncomplicated: Secondary | ICD-10-CM | POA: Insufficient documentation

## 2014-06-28 DIAGNOSIS — K709 Alcoholic liver disease, unspecified: Secondary | ICD-10-CM | POA: Insufficient documentation

## 2014-06-28 DIAGNOSIS — F10939 Alcohol use, unspecified with withdrawal, unspecified: Secondary | ICD-10-CM | POA: Insufficient documentation

## 2014-06-28 DIAGNOSIS — R945 Abnormal results of liver function studies: Secondary | ICD-10-CM

## 2014-06-28 DIAGNOSIS — F10239 Alcohol dependence with withdrawal, unspecified: Secondary | ICD-10-CM

## 2014-06-28 LAB — GLUCOSE, CAPILLARY
GLUCOSE-CAPILLARY: 133 mg/dL — AB (ref 70–99)
GLUCOSE-CAPILLARY: 228 mg/dL — AB (ref 70–99)
Glucose-Capillary: 136 mg/dL — ABNORMAL HIGH (ref 70–99)
Glucose-Capillary: 137 mg/dL — ABNORMAL HIGH (ref 70–99)
Glucose-Capillary: 158 mg/dL — ABNORMAL HIGH (ref 70–99)

## 2014-06-28 LAB — COMPREHENSIVE METABOLIC PANEL
ALT: 109 U/L — AB (ref 0–53)
ANION GAP: 12 (ref 5–15)
AST: 256 U/L — AB (ref 0–37)
Albumin: 3.5 g/dL (ref 3.5–5.2)
Alkaline Phosphatase: 307 U/L — ABNORMAL HIGH (ref 39–117)
BUN: 6 mg/dL (ref 6–23)
CHLORIDE: 102 mmol/L (ref 96–112)
CO2: 21 mmol/L (ref 19–32)
Calcium: 8.9 mg/dL (ref 8.4–10.5)
Creatinine, Ser: 0.56 mg/dL (ref 0.50–1.35)
GFR calc Af Amer: >90 mL/min (ref >90)
GFR calc non Af Amer: >90 mL/min (ref >90)
GLUCOSE: 154 mg/dL — AB (ref 70–99)
Potassium: 3.5 mmol/L (ref 3.5–5.1)
Sodium: 135 mmol/L (ref 135–145)
Total Bilirubin: 7.4 mg/dL — ABNORMAL HIGH (ref 0.3–1.2)
Total Protein: 7 g/dL (ref 6.0–8.3)

## 2014-06-28 LAB — CBC
HCT: 43 % (ref 39.0–52.0)
HEMOGLOBIN: 13.4 g/dL (ref 13.0–17.0)
MCH: 28.9 pg (ref 26.0–34.0)
MCHC: 31.2 g/dL (ref 30.0–36.0)
MCV: 92.9 fL (ref 78.0–100.0)
Platelets: 71 10*3/uL — ABNORMAL LOW (ref 150–400)
RBC: 4.63 MIL/uL (ref 4.22–5.81)
RDW: 19.4 % — ABNORMAL HIGH (ref 11.5–15.5)
WBC: 5.3 10*3/uL (ref 4.0–10.5)

## 2014-06-28 LAB — AMMONIA: AMMONIA: 143 umol/L — AB (ref 11–32)

## 2014-06-28 MED ORDER — LORAZEPAM 1 MG PO TABS
1.0000 mg | ORAL_TABLET | Freq: Once | ORAL | Status: AC
  Administered 2014-06-28: 1 mg via ORAL
  Filled 2014-06-28: qty 1

## 2014-06-28 NOTE — Progress Notes (Addendum)
[facility] PROGRESS NOTE  Robert Colon UEA:[id number] DOB: 06-28-1960 DOA: 06/25/2014 PCP: Pcp Not In System  Assessment/Plan:  Hepatic encephalopathy Patient presented with elevated liver enzymes, elevated levels of ammonia as per report from [facility]. We started the patient on lactulose 20 g 4 times a day also continue with rifaximin 550 mg by mouth twice a day. This morning ammonia level is 143 We will continue lactulose 20 g 4 times a day  Alcohol withdrawal  patient presented with tremors, was started on CIWA protocol along with thiamine and folate. 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 Today platelets 71 Follow cbc in am  Hypokalemia Replete, K+ this morning is 3.5  Transaminitis Likely from alcoholic hepatitis Patient had liver biopsy 3 years ago which was negative for liver cirrhosis Abdominal ultrasound to be done today   follow the results  Diabetes mellitus Blood glucose is well-controlled Continue Lantus, will also start sliding scale insulin with NovoLog  History of pancreatitis Continue pancreatic enzymes 3 times a day  DVT prophylaxis SCD  Code Status: Full code Family Communication: No family at bedside Disposition Plan: Transfer back to [facility] when medically stable   Consultants:  None  Procedures:   None  Antibiotics:  None  HPI/Subjective: 54 year old male with a history of diabetes mellitus, pancreatics, long-standing alcohol abuse who was [facility] This morning patient was very shaky unable to stand became very dizzy , had tremors. Patient was sent to the ED for further evaluation. Patient denies history of seizures or DTs in the past. He has been in the same rehabilitation facility in February, but patient says that he relapsed so he came this time for a 60 day program. He denies any  chest pain, complains of intermittent headache, no shortness of breath. No nausea vomiting or diarrhea. [facility] called and told that patient's ammonia level was more than 200. ED physician has already ordered ammonia level in the ED and a dose of lactulose has been given. This morning he feels better, but continues to have little gait imbalance on walking   Objective: Filed Vitals:   06/28/14 0537  BP: 143/99  Pulse: 92  Temp: 98.6 F (37 C)  Resp: 20    Intake/Output Summary (Last 24 hours) at 06/28/14 1148 Last data filed at 06/28/14 0900  Gross per 24 hour  Intake 2323.75 ml  Output    700 ml  Net 1623.75 ml   There were no vitals filed for this visit.  Exam:   General:  Appear in no acute distress  Cardiovascular: S1s2 RRR  Respiratory: Clear bilaterally  Abdomen: Soft, nontender  Musculoskeletal:  No edema   Data Reviewed: Basic Metabolic Panel:  Recent Labs Lab 06/25/14 0837 06/26/14 0515 06/27/14 0420 06/28/14 0423  NA 136 138 139 135  K 3.6 3.2* 4.1 3.5  CL 102 101 103 102  CO2 23 23 16* 21  GLUCOSE 119* 126* 82 154*  BUN 7 <5* 5* 6  CREATININE 0.57 0.56 0.39* 0.56  CALCIUM 8.3* 8.6 8.7 8.9   Liver Function Tests:  Recent Labs Lab 06/25/14 0837 06/26/14 0515 06/28/14 0423  AST 484* 424* 256*  ALT 157* 139* 109*  ALKPHOS 289* 295* 307*  BILITOT 4.1* 5.8* 7.4*  PROT 7.2 6.9 7.0  ALBUMIN 3.9 3.8 3.5   No results for input(s): LIPASE, AMYLASE in the last 168 hours.  Recent Labs Lab 06/25/14 1424  06/26/14 0646 06/27/14 0920 06/28/14 0833  AMMONIA 76* 115* 94* 143*   CBC:  Recent Labs Lab 06/25/14 0837 06/26/14 0515 06/28/14 0423  WBC 4.2 4.0 5.3  HGB 12.4* 12.5* 13.4  HCT 38.7* 39.4 43.0  MCV 90.6 92.3 92.9  PLT 60* 53* 71*   Cardiac Enzymes: No results for input(s): CKTOTAL, CKMB, CKMBINDEX, TROPONINI in the last 168 hours. BNP (last 3 results) No results for input(s): BNP in the last 8760 hours.  ProBNP (last  3 results) No results for input(s): PROBNP in the last 8760 hours.  CBG:  Recent Labs Lab 06/27/14 0757 06/27/14 1208 06/27/14 1644 06/27/14 2204 06/28/14 0724  GLUCAP 92 156* 134* 188* 133*    No results found for this or any previous visit (from the past 240 hour(s)).   Studies: No results found.  Scheduled Meds: . FLUoxetine  20 mg Oral Daily  . folic acid  1 mg Oral Daily  . gabapentin  300 mg Oral QHS  . insulin aspart  0-9 Units Subcutaneous TID WC  . insulin glargine  16 Units Subcutaneous Daily  . lactulose  20 g Oral QID  . lipase/protease/amylase  24,000 Units Oral TID AC  . LORazepam  0-4 mg Oral Q6H   Followed by  . LORazepam  0-4 mg Oral Q12H  . metoprolol succinate  50 mg Oral Daily  . multivitamin with minerals  1 tablet Oral Daily  . pantoprazole  40 mg Oral BID  . rifaximin  550 mg Oral BID  . thiamine  100 mg Oral Daily   Or  . thiamine  100 mg Intravenous Daily   Continuous Infusions: . 0.9 % NaCl with KCl 20 mEq / L 75 mL/hr at 06/27/14 1932    Active Problems:   Ataxia   Alcohol abuse   Hepatic encephalopathy   Diabetes mellitus    Time spent: 25 min    [facility],Lyanna Blystone S  [facility] Pager [phone]. If 7PM-7AM, please contact night-coverage at [email or web address], password [email or web address] 06/28/2014, 11:48 AM  LOS: 3 days

## 2014-06-28 NOTE — Progress Notes (Signed)
CSW continuing to follow.  Pt admitted from [place] and hopeful to return upon discharge.  Per MD, pt not yet medically ready for discharge today.   CSW sent updated clinicals to [facility] and spoke with RN, Coralyn Mark at [facility]. [facility] RN, Coralyn Mark stated that main concern for [facility] at this time is pt ability to ambulate and provide self care in order to adequately participate in the program. CSW discussed with [facility] that PT will evaluate pt today and CSW can provide [facility] with that information.   CSW met with pt at bedside. CSW discussed with pt about [facility] main concern being pt mobility and ability to provide self care. CSW encouraged pt to participate in PT this afternoon and pt expressed that he wants to eat lunch since he has been NPO for a procedure and would be willing to work with therapy this afternoon.  CSW to continue to follow to provide support and assist with disposition needs.   Alison Murray, MSW, [place] Work [phone]

## 2014-06-28 NOTE — Evaluation (Addendum)
Physical Therapy Evaluation Patient Details Name: Robert Colon MRN: [id number] DOB: [birth year] Today's Date: 06/28/2014   History of Present Illness  Hepatic encephalopathy, Admitted [facility]/25/14 From [facility] for [facility], started on CIWA protocaol, increased ammonia.thrombocyopenia  Clinical Impression  Unfortunately patient's score on baalnce test indicates  A fall risk, issues are  With higher level such as steps,, turning and changing position,  Activities that require eyes closed and standing (such as a shower).   Patient noted with  Decreased sensation in lower legs which is contributing to balance issues. Patient states that he walked without issue  Prior to  Recent admit. Patient will benefit from PT to address problems listed in note below.  Discussed with patient the balance issues demonstrated, he acknowledged that he has balance problems at this time. He stated he was hopeful to return to [facility].     Follow Up Recommendations Supervision/Assistance - 24 hour;SNF for short term  May be beneficial    Equipment Recommendations  Rolling walker with 5" wheels    Recommendations for Other Services       Precautions / Restrictions Precautions Precautions: Fall      Mobility  Bed Mobility Overal bed mobility: Independent                Transfers Overall transfer level: Modified independent               General transfer comment: pt uses IV pole in room to get to Bathroom  Ambulation/Gait Ambulation/Gait assistance: Min guard;Min assist Ambulation Distance (Feet): 400 Feet Assistive device: None Gait Pattern/deviations: Step-through pattern;Drifts right/left;Staggering right;Staggering left;Wide base of support Gait velocity: increased speed.   General Gait Details: patient ambulated without UE support, when looks up noted  decreased balance. Patient reports  that causes dizziness, tends to look at floor, may be also due to impaired sensation..   paaptient with not overt balance loss on levels and when ambulating in straight line. Turns   will cause off balance, patientt has to regain balance.  Stairs            Wheelchair Mobility    Modified Rankin (Stroke Patients Only)       Balance Overall balance assessment: Needs assistance                               Standardized Balance Assessment Standardized Balance Assessment : Berg Balance Test Berg Balance Test Sit to Stand: Able to stand  independently using hands=3 Standing Unsupported: Able to stand safely 2 minutes=4 Sitting with Back Unsupported but Feet Supported on Floor or Stool: Able to sit safely and securely 2 minutes=4 Stand to Sit: Uses backs of legs against chair to control descent=2 Transfers: Able to transfer safely, definite need of hands=3 Standing Unsupported with Eyes Closed: Able to stand 10 seconds with supervision=3 Standing Ubsupported with Feet Together: Needs help to attain position and unable to hold for 15 seconds=0 From Standing, Reach Forward with Outstretched Arm: Can reach forward >12 cm safely (5")=3 From Standing Position, Pick up Object from Floor: Able to pick up shoe, needs supervision=3 From Standing Position, Turn to Look Behind Over each Shoulder: Turn sideways only but maintains balance=2 Turn 360 Degrees: Able to turn 360 degrees safely but slowly=2 Standing Unsupported, Alternately Place Feet on Step/Stool: Needs assistance to keep from falling or unable to try=0 Standing Unsupported, One Foot in Front: Loses balance while stepping or standing=0 Standing on  One Leg: Unable to try or needs assist to prevent fall=0    score 29/55 indicates a high fall risk.      Pertinent Vitals/Pain Pain Assessment: No/denies pain    Home Living Family/patient expects to be discharged to:: Private residence                 Additional Comments: 52 yo son  every 7 days lives w/patient. Was planning on [facility]     Prior Function                 Hand Dominance        Extremity/Trunk Assessment   Upper Extremity Assessment: Generalized weakness;RUE deficits/detail;LUE deficits/detail RUE Deficits / Details: noted  fine tremors in  both extremeties         Lower Extremity Assessment: Generalized weakness;RLE deficits/detail;LLE deficits/detail RLE Deficits / Details: reports impaired sensation from knees  to foot LLE Deficits / Details: same as R, maybe more impaired based on difficulty stepping with R leg.  Cervical / Trunk Assessment: Normal (tremors in trunk noted)  Communication      Cognition Arousal/Alertness: Awake/alert Behavior During Therapy: Flat affect Overall Cognitive Status: Impaired/Different from baseline Area of Impairment: Orientation Orientation Level: Disoriented to             General Comments: stated pril, tuesday, 25th    General Comments General comments (skin integrity, edema, etc.): score 29/55 indicates a  high fall risk    Exercises        Assessment/Plan    PT Assessment Patient needs continued PT services  PT Diagnosis Difficulty walking;Abnormality of gait   PT Problem List Decreased strength;Decreased balance;Decreased coordination;Impaired sensation;Decreased cognition;Decreased knowledge of precautions;Decreased safety awareness  PT Treatment Interventions Gait training;Functional mobility training;DME instruction;Therapeutic activities;Therapeutic exercise;Balance training;Patient/family education   PT Goals (Current goals can be found in the Care Plan section) Acute Rehab PT Goals Patient Stated Goal: to go to [name] PT Goal Formulation: With patient Time For Goal Achievement: 07/12/14 Potential to Achieve Goals: Good    Frequency Min 3X/week   Barriers to discharge        Co-evaluation               End of Session Equipment Utilized During Treatment: Gait belt Activity Tolerance: Patient tolerated  treatment well Patient left: in bed;with call bell/phone within reach;with nursing/sitter in room Nurse Communication: Mobility status         Time: [id number] PT Time Calculation (min) (ACUTE ONLY): 27 min   Charges:   PT Evaluation $Initial PT Evaluation Tier I: 1 Procedure PT Treatments $Gait Training: 8-22 mins   PT G Codes:        Rada HayHill, Mainor Hellmann Elizabeth 06/28/2014, 6:01 PM Blanchard KelchKaren Paulo Keimig PT [phone]

## 2014-06-28 NOTE — Progress Notes (Signed)
PT Cancellation Note  Patient Details Name: Robert Colon MRN: [id number] DOB: 10-01-1960   Cancelled Treatment:    Reason Eval/Treat Not Completed: Medical issues which prohibited therapy (patient asks to wait for CT ABdomen completed and  he eats. Patient reports up adlib to BR, observed patient ambulating w/ RN 4/25 in hall. will check back this PM.)   Rada HayHill, Destyni Hoppel Elizabeth 06/28/2014, 8:59 AM

## 2014-06-29 LAB — COMPREHENSIVE METABOLIC PANEL
ALT: 86 U/L — ABNORMAL HIGH (ref 0–53)
AST: 166 U/L — ABNORMAL HIGH (ref 0–37)
Albumin: 3.7 g/dL (ref 3.5–5.2)
Alkaline Phosphatase: 289 U/L — ABNORMAL HIGH (ref 39–117)
Anion gap: 11 (ref 5–15)
BILIRUBIN TOTAL: 8.3 mg/dL — AB (ref 0.3–1.2)
BUN: 6 mg/dL (ref 6–23)
CO2: 20 mmol/L (ref 19–32)
Calcium: 8.8 mg/dL (ref 8.4–10.5)
Chloride: 103 mmol/L (ref 96–112)
Creatinine, Ser: 0.49 mg/dL — ABNORMAL LOW (ref 0.50–1.35)
Glucose, Bld: 215 mg/dL — ABNORMAL HIGH (ref 70–99)
POTASSIUM: 3.6 mmol/L (ref 3.5–5.1)
Sodium: 134 mmol/L — ABNORMAL LOW (ref 135–145)
TOTAL PROTEIN: 6.9 g/dL (ref 6.0–8.3)

## 2014-06-29 LAB — GLUCOSE, CAPILLARY
GLUCOSE-CAPILLARY: 170 mg/dL — AB (ref 70–99)
Glucose-Capillary: 189 mg/dL — ABNORMAL HIGH (ref 70–99)
Glucose-Capillary: 206 mg/dL — ABNORMAL HIGH (ref 70–99)

## 2014-06-29 MED ORDER — ZOLPIDEM TARTRATE 5 MG PO TABS
5.0000 mg | ORAL_TABLET | Freq: Once | ORAL | Status: AC
  Administered 2014-06-29: 5 mg via ORAL
  Filled 2014-06-29: qty 1

## 2014-06-29 MED ORDER — LACTULOSE 10 GM/15ML PO SOLN
20.0000 g | Freq: Three times a day (TID) | ORAL | Status: DC
  Administered 2014-06-29 – 2014-07-01 (×6): 20 g via ORAL
  Filled 2014-06-29 (×6): qty 30

## 2014-06-29 MED ORDER — LACTULOSE 10 GM/15ML PO SOLN: 20.0000 g | Freq: Three times a day (TID) | ORAL | Status: AC

## 2014-06-29 MED ORDER — LORAZEPAM 1 MG PO TABS
0.0000 mg | ORAL_TABLET | ORAL | Status: DC | PRN
  Administered 2014-06-29 – 2014-06-30 (×5): 1 mg via ORAL
  Administered 2014-06-30: 2 mg via ORAL
  Administered 2014-06-30 – 2014-07-01 (×4): 1 mg via ORAL
  Filled 2014-06-29: qty 1
  Filled 2014-06-29 (×2): qty 2
  Filled 2014-06-29 (×8): qty 1

## 2014-06-29 NOTE — Progress Notes (Signed)
CSW continuing to follow.  CSW reviewed PT note and pt walked with min guard assistance, but still some balance issues when ambulating. PT recommends 24 hour supervision; questions SNF for short term.  CSW met with pt at bedside. Pt expressed that he feels that he is slowly improving, but still feels that he is having difficulty with his balance. CSW discussed that [facility] will likely not feel pt is appropriate to return unless pt is able to provide his own self care and ambulate independently.   CSW discussed with pt recommendations from PT regarding 24 hour supervision; questionable SNF. CSW discussed with pt that CSW can explore SNF for pt, but CSW cannot guarantee that pt insurance will approve SNF. Pt states that he would prefer to return home with his family if he is unable to return to [facility].   CSW inquired with pt about his support at home and pt reports that he lives with pt sister and has support from his girlfriend and son. CSW inquired with pt if these individuals would be able to provide around the clock supervision. Pt reports that they will be able to provide 24 hour supervision. Pt states that he would be agreeable to home health or outpatient therapies at home.   Pt reports that ideally he would like to return to [facility], but recognizes that his level of independence needs to improve in order to be able to return.   CSW to continue to follow to provide support and assist with disposition needs.   Alison Murray, MSW, [place] Work [phone]

## 2014-06-29 NOTE — Care Management Note (Signed)
CARE MANAGEMENT NOTE 06/29/2014  Patient:  Robert DartingSPARGUR,Robert   Account Number:  [id number]  Date Initiated:  06/29/2014  Documentation initiated by:  Sandford CrazeLEMENTS,Thurmond Hildebran  Subjective/Objective Assessment:   54 yo admitted with Ataxia     Action/Plan:   [facility] and would like to return there at DC.   Anticipated DC Date:  07/01/2014   Anticipated DC Plan:  IP REHAB FACILITY  In-house referral  Clinical Social Worker      DC Planning Services  CM consult      Choice offered to / List presented to:             Status of service:  In process, will continue to follow Medicare Important Message given?   (If response is "NO", the following Medicare IM given date fields will be blank) Date Medicare IM given:   Medicare IM given by:   Date Additional Medicare IM given:   Additional Medicare IM given by:    Discharge Disposition:    Per UR Regulation:  Reviewed for med. necessity/level of care/duration of stay  If discussed at Long Length of Stay Meetings, dates discussed:    Comments:  06/29/14 Sandford Crazeora Aleynah Rocchio RN,BSN,NCM [phone] Chart reviewed and CM following for DC needs.   Bartholome BillCLEMENTS, Jorma Tassinari H, RN 06/29/2014, 3:47 PM

## 2014-06-29 NOTE — Progress Notes (Signed)
[facility] PROGRESS NOTE  Robert Colon ZOX:[id number] DOB: 12/17/1960 DOA: 06/25/2014 PCP: Pcp Not In System  Assessment/Plan:  Hepatic encephalopathy Patient presented with elevated liver enzymes, elevated levels of ammonia as per report from [facility].  lactulose 20 g 4 times a day  rid on 4.27 Continue rifaximin 550 mg by mouth twice a day. Not confused but tremulous still  Alcohol withdrawal Continue CIWA protocol along with thiamine and folate. 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 Today platelets 71 Follow cbc in am  Hypokalemia Replete, K+ this morning is 3.5 CMET in am  Transaminitis Likely from alcoholic hepatitis Patient had liver biopsy 3 years ago which was negative for liver cirrhosis Abdominal ultrasound 4/26-cirrhosis Needs OP screening  Diabetes mellitus Blood glucose is well-controlled Continue Lantus, will also start sliding scale insulin with NovoLog  History of pancreatitis Continue pancreatic enzymes 3 times a day  DVT prophylaxis SCD  Code Status: Full code Family Communication: No family at bedside Disposition Plan: Transfer back to [facility] vs [place] if stable   Consultants:  None  Procedures:   None  Antibiotics:  None  HPI/Subjective: Fair NO c/o ++ stool NO n?v Tremulous No CP nor fever  Objective: Filed Vitals:   06/29/14 0621  BP: 131/93  Pulse: 113  Temp: 98.8 F (37.1 C)  Resp: 18    Intake/Output Summary (Last 24 hours) at 06/29/14 1223 Last data filed at 06/29/14 0600  Gross per 24 hour  Intake 1642.5 ml  Output      0 ml  Net 1642.5 ml   There were no vitals filed for this visit.  Exam:   General:  Appear in no acute distress  Cardiovascular: S1s2 RRR  Respiratory: Clear bilaterally  Abdomen: Soft, nontender  Musculoskeletal:  No edema   Data Reviewed: Basic Metabolic Panel:  Recent  Labs Lab 06/25/14 0837 06/26/14 0515 06/27/14 0420 06/28/14 0423  NA 136 138 139 135  K 3.6 3.2* 4.1 3.5  CL 102 101 103 102  CO2 23 23 16* 21  GLUCOSE 119* 126* 82 154*  BUN 7 <5* 5* 6  CREATININE 0.57 0.56 0.39* 0.56  CALCIUM 8.3* 8.6 8.7 8.9   Liver Function Tests:  Recent Labs Lab 06/25/14 0837 06/26/14 0515 06/28/14 0423  AST 484* 424* 256*  ALT 157* 139* 109*  ALKPHOS 289* 295* 307*  BILITOT 4.1* 5.8* 7.4*  PROT 7.2 6.9 7.0  ALBUMIN 3.9 3.8 3.5   No results for input(s): LIPASE, AMYLASE in the last 168 hours.  Recent Labs Lab 06/25/14 1424 06/26/14 0646 06/27/14 0920 06/28/14 0833  AMMONIA 76* 115* 94* 143*   CBC:  Recent Labs Lab 06/25/14 0837 06/26/14 0515 06/28/14 0423  WBC 4.2 4.0 5.3  HGB 12.4* 12.5* 13.4  HCT 38.7* 39.4 43.0  MCV 90.6 92.3 92.9  PLT 60* 53* 71*   Cardiac Enzymes: No results for input(s): CKTOTAL, CKMB, CKMBINDEX, TROPONINI in the last 168 hours. BNP (last 3 results) No results for input(s): BNP in the last 8760 hours.  ProBNP (last 3 results) No results for input(s): PROBNP in the last 8760 hours.  CBG:  Recent Labs Lab 06/28/14 1140 06/28/14 1241 06/28/14 1649 06/28/14 2214 06/29/14 0712  GLUCAP 136* 137* 228* 158* 189*    No results found for this or any previous visit (from the past 240 hour(s)).   Studies: US Abdomen Complete  06/28/2014   CLINICAL DATA:  Transaminitis. Diarrhea.  History of alcoholic liver disease.  EXAM: ULTRASOUND ABDOMEN COMPLETE  COMPARISON:  None.  FINDINGS: Gallbladder: No gallstones or wall thickening visualized. No sonographic Murphy sign noted.  Common bile duct: Diameter: 3 mm.  Difficult to visualize.  Liver: Appears enlarged. Diffusely increased and coarsened echotexture raises the possibility of hepatic steatosis. Slight micronodularity of the liver contour is suspected. The portal vein is patent is patent and demonstrates normal direction of flow.  IVC: The suprahepatic IVC is  visualized in the sagittal projection and appears within normal limits. The infrahepatic IVC was not well seen.  Pancreas: Visualized portion unremarkable.  Spleen: Spleen is prominent in size measuring with 14.1 x 14.1 x 8.2 cm, with a calculated volume of 882.1 cm cubed.  Right Kidney: Length: 12.0 cm. Echogenicity within normal limits. No mass or hydronephrosis visualized.  Left Kidney: Length: 12.8 cm. Echogenicity within normal limits. Negative for hydronephrosis. 2.3 x 2.2 x 2.3 cm parapelvic cysts noted.  Abdominal aorta: No aneurysm visualized. Maximum AP diameter is 2.3 cm.  Other findings: None.  IMPRESSION: 1. Abnormal coarsened/increased echotexture of the liver raises the possibility of diffuse fatty infiltration of the liver. Hepatic cirrhosis cannot be excluded. A subtle micronodular hepatic contour is suspected. 2. Splenomegaly. If the patient does have cirrhosis, this could be secondary to portal hypertension. 3. Simple left renal cyst.   Electronically Signed   By: Britta MccreedySusan  Turner M.D.   On: 06/28/2014 14:00    Scheduled Meds: . FLUoxetine  20 mg Oral Daily  . folic acid  1 mg Oral Daily  . gabapentin  300 mg Oral QHS  . insulin aspart  0-9 Units Subcutaneous TID WC  . insulin glargine  16 Units Subcutaneous Daily  . lactulose  20 g Oral TID  . lipase/protease/amylase  24,000 Units Oral TID AC  . LORazepam  0-4 mg Oral Q12H  . metoprolol succinate  50 mg Oral Daily  . multivitamin with minerals  1 tablet Oral Daily  . pantoprazole  40 mg Oral BID  . rifaximin  550 mg Oral BID  . thiamine  100 mg Oral Daily   Or  . thiamine  100 mg Intravenous Daily   Continuous Infusions:    Active Problems:   Ataxia   Alcohol abuse   Hepatic encephalopathy   Diabetes mellitus   Alcohol withdrawal   Alcoholic liver disease   Chronic alcohol abuse   Elevated liver function tests   Increased ammonia level    Time spent: 25 min   Pleas KochJai Essynce Munsch, MD [facility] [phone])  [phone]

## 2014-06-30 LAB — GLUCOSE, CAPILLARY
GLUCOSE-CAPILLARY: 237 mg/dL — AB (ref 70–99)
GLUCOSE-CAPILLARY: 189 mg/dL — AB (ref 70–99)
GLUCOSE-CAPILLARY: 186 mg/dL — AB (ref 70–99)
Glucose-Capillary: 194 mg/dL — ABNORMAL HIGH (ref 70–99)
Glucose-Capillary: 216 mg/dL — ABNORMAL HIGH (ref 70–99)

## 2014-06-30 MED ORDER — CHLORDIAZEPOXIDE HCL 5 MG PO CAPS: 10.0000 mg | ORAL_CAPSULE | Freq: Once | ORAL | Status: AC | PRN

## 2014-06-30 NOTE — Progress Notes (Signed)
Physical Therapy Treatment Patient Details Name: Robert Colon MRN: [id number] DOB: April 03, 1960 Today's Date: 06/30/2014    History of Present Illness Hepatic encephalopathy, Admitted 06/25/14 From [facility] for ETOH, started on CIWA protocaol, increased ammonia.thrombocyopenia    PT Comments    Pt stated he was "doing better" and getting self around in room without assist.  Amb pt around unit twice with out an AD. No LOB and good alternating gait. Practiced going up/down stairs with one rail.  Alternating steps ascending stairs but step to descending stairs. Performed BERG balance test which improved from a score of 29 (HIGH FALL RISK) to 44 (min fall risk).  No AD indicated for gait.  Follow Up Recommendations  Home health PT (home safety eval)  [facility]   Equipment Recommendations  None recommended by PT    Recommendations for Other Services       Precautions / Restrictions Precautions Precautions: Fall    Mobility  Bed Mobility Overal bed mobility: Independent                Transfers Overall transfer level: Modified independent               General transfer comment: pt getting self around room and to and from BR with out assistance  Ambulation/Gait Ambulation/Gait assistance: Supervision Ambulation Distance (Feet): 800 Feet Assistive device: None Gait Pattern/deviations: Step-through pattern Gait velocity: WNL   General Gait Details: good alternating gait with NO LOB.  Pt stated, he feels "more steady".  Performed dynamic standing activities such as side stepping at [facility], backward gait at Supervision and braiding [facility].  No true LOB.  Very minimal lateral  steppage, pt able to self correct.     Stairs Stairs: Yes Stairs assistance: Min guard Stair Management: One rail Left;Alternating pattern;Forwards Number of Stairs: 12 General stair comments: alternating steps going up ans step to going  down with use of one rail.  Performed well.   Wheelchair Mobility    Modified Rankin (Stroke Patients Only)       Balance    BERG balance score 44/56 Greatest difficulty was one leg stance, tandum stance and alternating step on stool.    BERG score did improve from 29/56 to 44/56 past 2 days.                            Cognition Arousal/Alertness: Awake/alert Behavior During Therapy: WFL for tasks assessed/performed Overall Cognitive Status: Within Functional Limits for tasks assessed                      Exercises      General Comments        Pertinent Vitals/Pain Pain Assessment: No/denies pain    Home Living                      Prior Function            PT Goals (current goals can now be found in the care plan section) Progress towards PT goals: Progressing toward goals    Frequency  Min 3X/week    PT Plan      Co-evaluation             End of Session Equipment Utilized During Treatment: Gait belt Activity Tolerance: Patient tolerated treatment well Patient left: in bed;with call bell/phone within reach;with nursing/sitter in room  Time: 1105-1130 PT Time Calculation (min) (ACUTE ONLY): 25 min  Charges:  $Gait Training: 8-22 mins $Therapeutic Activity: 8-22 mins                    G Codes:      Felecia Shelling  PTA [facility]  Acute  Rehab Pager      [phone]

## 2014-06-30 NOTE — Progress Notes (Signed)
CSW continuing to follow.   CSW met with pt at bedside. Pt expressed that he is feeling better and feels that he is doing better with getting self around without assistance. Pt feels that he will be improved enough to return to [facility] and wishes to do so upon d/c.   CSW spoke with [facility] and sent updated clinicals to facility. CSW spoke with [facility] RN, Coralyn Mark who stated that facility can accept pt back upon discharge and have discussed with pt and pt significant other that [facility] can arrange for a CNA at facility if pt needed. Pt aware, but feels that he will be able to manage his care.   CSW to continue to follow to provide support and assist with transition back to [facility] upon d/c.  Alison Murray, MSW, [facility] [phone]

## 2014-06-30 NOTE — Progress Notes (Signed)
[facility] PROGRESS NOTE  Robert DartingRodney Colon [id number] DOB: 02-23-1961 DOA: 06/25/2014 PCP: Pcp Not In System  Assessment/Plan:  Hepatic encephalopathy Patient presented with elevated liver enzymes, elevated levels of ammonia as per report from [facility].  lactulose 20 g 4 times a day  tid on 4.27 Continue rifaximin 550 mg by mouth twice a day.  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 Today platelets 71 Follow cbc in am  Hypokalemia Replete, K+ is stable CMET in am  Transaminitis with ESLD Likely from alcoholic hepatitis Patient had liver biopsy 3 years ago which was negative for liver cirrhosis Abdominal ultrasound 4/26-cirrhosis Needs OP screening  Diabetes mellitus Blood glucose is well-controlled between 189-194 Continue Lantus, will also start sliding scale insulin with NovoLog  History of pancreatitis Continue pancreatic enzymes 3 times a day  DVT prophylaxis SCD  Code Status: Full code Family Communication: No family at bedside Disposition Plan: [facility] [place] if stable in 49 hours or so   Consultants:  None  Procedures:   None  Antibiotics:  None  HPI/Subjective:  Less tremulous today Walked the halls  Balance was a little off Vomit x 1   Objective: Filed Vitals:   06/30/14 0513  BP: 125/77  Pulse: 98  Temp: 98.8 F (37.1 C)  Resp: 20   No intake or output data in the 24 hours ending 06/30/14 1128 There were no vitals filed for this visit.  Exam:   General:  Appear in no acute distress  Cardiovascular: S1s2 RRR  Respiratory: Clear bilaterally  Abdomen: Soft, nontender  Musculoskeletal:  No edema   Data Reviewed: Basic Metabolic Panel:  Recent Labs Lab 06/25/14 0837  06/26/14 0515 06/27/14 0420 06/28/14 0423 06/29/14 1305  NA 136 138 139 135 134*  K 3.6 3.2* 4.1 3.5 3.6  CL 102 101 103 102 103  CO2 23 23 16* 21 20  GLUCOSE 119* 126* 82 154* 215*  BUN 7 <5* 5* 6 6  CREATININE 0.57 0.56 0.39* 0.56 0.49*  CALCIUM 8.3* 8.6 8.7 8.9 8.8   Liver Function Tests:  Recent Labs Lab 06/25/14 0837 06/26/14 0515 06/28/14 0423 06/29/14 1305  AST 484* 424* 256* 166*  ALT 157* 139* 109* 86*  ALKPHOS 289* 295* 307* 289*  BILITOT 4.1* 5.8* 7.4* 8.3*  PROT 7.2 6.9 7.0 6.9  ALBUMIN 3.9 3.8 3.5 3.7   No results for input(s): LIPASE, AMYLASE in the last 168 hours.  Recent Labs Lab 06/25/14 1424 06/26/14 0646 06/27/14 0920 06/28/14 0833  AMMONIA 76* 115* 94* 143*   CBC:  Recent Labs Lab 06/25/14 0837 06/26/14 0515 06/28/14 0423  WBC 4.2 4.0 5.3  HGB 12.4* 12.5* 13.4  HCT 38.7* 39.4 43.0  MCV 90.6 92.3 92.9  PLT 60* 53* 71*   Cardiac Enzymes: No results for input(s): CKTOTAL, CKMB, CKMBINDEX, TROPONINI in the last 168 hours. BNP (last 3 results) No results for input(s): BNP in the last 8760 hours.  ProBNP (last 3 results) No results for input(s): PROBNP in the last 8760 hours.  CBG:  Recent Labs Lab 06/29/14 0712 06/29/14 1151 06/29/14 1700 06/29/14 2034 06/30/14 0730  GLUCAP 189* 206* 170* 237* 194*    No results found for this or any previous visit (from the past 240 hour(s)).   Studies:  US Abdomen Complete  06/28/2014   CLINICAL DATA:  Transaminitis. Diarrhea. History of alcoholic liver disease.  EXAM: ULTRASOUND ABDOMEN COMPLETE  COMPARISON:  None.  FINDINGS: Gallbladder: No gallstones or wall thickening visualized. No sonographic Murphy sign noted.  Common bile duct: Diameter: 3 mm.  Difficult to visualize.  Liver: Appears enlarged. Diffusely increased and coarsened echotexture raises the possibility of hepatic steatosis. Slight micronodularity of the liver contour is suspected. The portal vein is patent is patent and  demonstrates normal direction of flow.  IVC: The suprahepatic IVC is visualized in the sagittal projection and appears within normal limits. The infrahepatic IVC was not well seen.  Pancreas: Visualized portion unremarkable.  Spleen: Spleen is prominent in size measuring with 14.1 x 14.1 x 8.2 cm, with a calculated volume of 882.1 cm cubed.  Right Kidney: Length: 12.0 cm. Echogenicity within normal limits. No mass or hydronephrosis visualized.  Left Kidney: Length: 12.8 cm. Echogenicity within normal limits. Negative for hydronephrosis. 2.3 x 2.2 x 2.3 cm parapelvic cysts noted.  Abdominal aorta: No aneurysm visualized. Maximum AP diameter is 2.3 cm.  Other findings: None.  IMPRESSION: 1. Abnormal coarsened/increased echotexture of the liver raises the possibility of diffuse fatty infiltration of the liver. Hepatic cirrhosis cannot be excluded. A subtle micronodular hepatic contour is suspected. 2. Splenomegaly. If the patient does have cirrhosis, this could be secondary to portal hypertension. 3. Simple left renal cyst.   Electronically Signed   By: Britta Mccreedy M.D.   On: 06/28/2014 14:00    Scheduled Meds: . FLUoxetine  20 mg Oral Daily  . folic acid  1 mg Oral Daily  . gabapentin  300 mg Oral QHS  . insulin aspart  0-9 Units Subcutaneous TID WC  . insulin glargine  16 Units Subcutaneous Daily  . lactulose  20 g Oral TID  . lipase/protease/amylase  24,000 Units Oral TID AC  . metoprolol succinate  50 mg Oral Daily  . multivitamin with minerals  1 tablet Oral Daily  . pantoprazole  40 mg Oral BID  . rifaximin  550 mg Oral BID  . thiamine  100 mg Oral Daily   Or  . thiamine  100 mg Intravenous Daily   Continuous Infusions:    Active Problems:   Ataxia   Alcohol abuse   Hepatic encephalopathy   Diabetes mellitus   Alcohol withdrawal   Alcoholic liver disease   Chronic alcohol abuse   Elevated liver function tests   Increased ammonia level    Time spent: 25 min   Pleas Koch,  MD [facility] [phone]

## 2014-07-01 LAB — CBC WITH DIFFERENTIAL/PLATELET
Basophils Absolute: 0.1 10*3/uL (ref 0.0–0.1)
Basophils Relative: 1 % (ref 0–1)
EOS ABS: 0.1 10*3/uL (ref 0.0–0.7)
Eosinophils Relative: 2 % (ref 0–5)
HCT: 39.4 % (ref 39.0–52.0)
Hemoglobin: 12.5 g/dL — ABNORMAL LOW (ref 13.0–17.0)
LYMPHS PCT: 21 % (ref 12–46)
Lymphs Abs: 1.2 10*3/uL (ref 0.7–4.0)
MCH: 29.1 pg (ref 26.0–34.0)
MCHC: 31.7 g/dL (ref 30.0–36.0)
MCV: 91.8 fL (ref 78.0–100.0)
Monocytes Absolute: 1.3 10*3/uL — ABNORMAL HIGH (ref 0.1–1.0)
Monocytes Relative: 22 % — ABNORMAL HIGH (ref 3–12)
Neutro Abs: 3.2 10*3/uL (ref 1.7–7.7)
Neutrophils Relative %: 54 % (ref 43–77)
Platelets: 145 10*3/uL — ABNORMAL LOW (ref 150–400)
RBC: 4.29 MIL/uL (ref 4.22–5.81)
RDW: 18.9 % — AB (ref 11.5–15.5)
WBC: 5.8 10*3/uL (ref 4.0–10.5)

## 2014-07-01 LAB — COMPREHENSIVE METABOLIC PANEL
ALK PHOS: 255 U/L — AB (ref 39–117)
ALT: 80 U/L — AB (ref 0–53)
AST: 154 U/L — ABNORMAL HIGH (ref 0–37)
Albumin: 3.2 g/dL — ABNORMAL LOW (ref 3.5–5.2)
Anion gap: 9 (ref 5–15)
BUN: 10 mg/dL (ref 6–23)
CHLORIDE: 103 mmol/L (ref 96–112)
CO2: 23 mmol/L (ref 19–32)
Calcium: 8.7 mg/dL (ref 8.4–10.5)
Creatinine, Ser: 0.53 mg/dL (ref 0.50–1.35)
GFR calc Af Amer: >90 mL/min (ref >90)
GFR calc non Af Amer: >90 mL/min (ref >90)
Glucose, Bld: 196 mg/dL — ABNORMAL HIGH (ref 70–99)
POTASSIUM: 3.3 mmol/L — AB (ref 3.5–5.1)
Sodium: 135 mmol/L (ref 135–145)
Total Bilirubin: 6.9 mg/dL — ABNORMAL HIGH (ref 0.3–1.2)
Total Protein: 6.4 g/dL (ref 6.0–8.3)

## 2014-07-01 LAB — PROTIME-INR
INR: 1.11 (ref 0.00–1.49)
Prothrombin Time: 14.4 seconds (ref 11.6–15.2)

## 2014-07-01 LAB — GLUCOSE, CAPILLARY
GLUCOSE-CAPILLARY: 165 mg/dL — AB (ref 70–99)
GLUCOSE-CAPILLARY: 206 mg/dL — AB (ref 70–99)

## 2014-07-01 MED ORDER — CHLORDIAZEPOXIDE HCL 10 MG PO CAPS: ORAL_CAPSULE | ORAL | Status: AC

## 2014-07-01 NOTE — Discharge Summary (Signed)
Physician Discharge Summary  Robert DartingRodney Colon [id number] DOB: March 20, 1960 DOA: 06/25/2014  PCP: Pcp Not In System  Dr. Doylene CanningJarsdorfer  Admit date: 06/25/2014 Discharge date: 07/01/2014  Time spent: 25 minutes  Recommendations for Outpatient Follow-up:  1. Will need further management at [facility] for detox-he is medically cleared from my standpoint for d/c back there   Discharge Diagnoses:  Active Problems:   Ataxia   Alcohol abuse   Hepatic encephalopathy   Diabetes mellitus   Alcohol withdrawal   Alcoholic liver disease   Chronic alcohol abuse   Elevated liver function tests   Increased ammonia level   Discharge Condition: fair--Gaurded if he continues to drink  Diet recommendation: Regular  There were no vitals filed for this visit.  History of present illness:  54 y/[place] came to [facility] for Rx ETOH habituation He was found to have severe DT's and Hyperammoniemia and was [facility]  Hospital Course:  Hepatic encephalopathy Patient presented with elevated liver enzymes, elevated levels of ammonia as per report from [facility].  lactulose 20 g 4 times a day  tid on 4.27-he should continue this chronically Continue rifaximin 550 mg by mouth twice a day.  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  MELD score 16 on discharge indicating 6 mo mortality 6% Likely from alcoholic hepatitis Patient had liver biopsy 3 years ago which was negative for liver cirrhosis Abdominal ultrasound 4/26-cirrhosis Needs OP screening  Diabetes mellitus Blood glucose is well-controlled between 189-194 Continue Lantus, will also start sliding scale  insulin with NovoLog  History of pancreatitis Continue pancreatic enzymes 3 times a day   Discharge Exam: Filed Vitals:   07/01/14 0520  BP: 129/83  Pulse: 88  Temp: 98 F (36.7 C)  Resp: 18    General: alert pleasant oriented in nad Cardiovascular: s1 s2 no m/r/g Respiratory:  clear  Discharge Instructions   Discharge Instructions    Diet - low sodium heart healthy    Complete by:  As directed      Discharge instructions    Complete by:  As directed   Will need further care at [facility]     Increase activity slowly    Complete by:  As directed           Current Discharge Medication List    CONTINUE these medications which have CHANGED   Details  lactulose (CHRONULAC) 10 GM/15ML solution Take 30 mLs (20 g total) by mouth 3 (three) times daily. Qty: 240 mL, Refills: 0      CONTINUE these medications which have NOT CHANGED   Details  CREON 24000 UNITS CPEP Take 2 capsules by mouth 3 (three) times daily. Refills: 0    FLUoxetine (PROZAC) 20 MG capsule Take 20 mg by mouth daily. Refills: 2    gabapentin (NEURONTIN) 300 MG capsule Take 300 mg by mouth at bedtime.    LANTUS SOLOSTAR 100 UNIT/ML Solostar Pen Inject 16 Units into the skin daily with breakfast.  Refills: 3    LORazepam (ATIVAN) 2 MG tablet Take 2 mg by mouth every 6 (six) hours as needed for anxiety. For 4 doses then stop at start 1.5  mg for 4 doses then stop and start 1 mg for 4 doses then stop and start 0.5 mg for 4 doses then stop    metoprolol succinate (TOPROL-XL) 50 MG 24 hr tablet Take 50 mg by mouth daily. Take with or immediately following a meal.    Multiple Vitamin (MULTIVITAMIN WITH MINERALS) TABS tablet Take 1 tablet by mouth daily.    NOVOLOG FLEXPEN 100 UNIT/ML FlexPen Inject 8 Units into the skin 3 (three) times daily. Refills: 3    pantoprazole (PROTONIX) 40 MG tablet Take 40 mg by mouth 2 (two) times daily. Refills: 3    thiamine 100 MG tablet Take 100 mg by mouth  daily.    traZODone (DESYREL) 50 MG tablet Take 50 mg by mouth at bedtime. As needed for insomnia    XIFAXAN 550 MG TABS tablet Take 550 mg by mouth 2 (two) times daily. Refills: 6      STOP taking these medications     carbamazepine (TEGRETOL) 200 MG tablet      Diazepam 10 MG/2ML SOAJ      carbamazepine (TEGRETOL) 100 MG chewable tablet        No Known Allergies    The results of significant diagnostics from this hospitalization (including imaging, microbiology, ancillary and laboratory) are listed below for reference.    Significant Diagnostic Studies: US Abdomen Complete  06/28/2014   CLINICAL DATA:  Transaminitis. Diarrhea. History of alcoholic liver disease.  EXAM: ULTRASOUND ABDOMEN COMPLETE  COMPARISON:  None.  FINDINGS: Gallbladder: No gallstones or wall thickening visualized. No sonographic Murphy sign noted.  Common bile duct: Diameter: 3 mm.  Difficult to visualize.  Liver: Appears enlarged. Diffusely increased and coarsened echotexture raises the possibility of hepatic steatosis. Slight micronodularity of the liver contour is suspected. The portal vein is patent is patent and demonstrates normal direction of flow.  IVC: The suprahepatic IVC is visualized in the sagittal projection and appears within normal limits. The infrahepatic IVC was not well seen.  Pancreas: Visualized portion unremarkable.  Spleen: Spleen is prominent in size measuring with 14.1 x 14.1 x 8.2 cm, with a calculated volume of 882.1 cm cubed.  Right Kidney: Length: 12.0 cm. Echogenicity within normal limits. No mass or hydronephrosis visualized.  Left Kidney: Length: 12.8 cm. Echogenicity within normal limits. Negative for hydronephrosis. 2.3 x 2.2 x 2.3 cm parapelvic cysts noted.  Abdominal aorta: No aneurysm visualized. Maximum AP diameter is 2.3 cm.  Other findings: None.  IMPRESSION: 1. Abnormal coarsened/increased echotexture of the liver raises the possibility of diffuse fatty infiltration of the  liver. Hepatic cirrhosis cannot be excluded. A subtle micronodular hepatic contour is suspected. 2. Splenomegaly. If the patient does have cirrhosis, this could be secondary to portal hypertension. 3. Simple left renal cyst.   Electronically Signed   By: Britta Mccreedy M.D.   On: 06/28/2014 14:00    Microbiology: No results found for this or any previous visit (from the past 240 hour(s)).   Labs: Basic Metabolic Panel:  Recent Labs Lab 06/26/14 0515 06/27/14 0420 06/28/14 0423 06/29/14 1305 07/01/14 0415  NA 138 139 135 134* 135  K 3.2* 4.1 3.5 3.6 3.3*  CL 101 103 102 103 103  CO2 23 16* GLUCOSE 126* 82 154* 215* 196*  BUN <5* 5* CREATININE 0.56 0.39* 0.56 0.49* 0.53  CALCIUM 8.6 8.7 8.9 8.8 8.7   Liver Function Tests:  Recent Labs Lab 06/25/14 0837 06/26/14 0515 06/28/14 0423 06/29/14  1305 07/01/14 0415  AST 484* 424* 256* 166* 154*  ALT 157* 139* 109* 86* 80*  ALKPHOS 289* 295* 307* 289* 255*  BILITOT 4.1* 5.8* 7.4* 8.3* 6.9*  PROT 7.2 6.9 7.0 6.9 6.4  ALBUMIN 3.9 3.8 3.5 3.7 3.2*   No results for input(s): LIPASE, AMYLASE in the last 168 hours.  Recent Labs Lab 06/25/14 1424 06/26/14 0646 06/27/14 0920 06/28/14 0833  AMMONIA 76* 115* 94* 143*   CBC:  Recent Labs Lab 06/25/14 0837 06/26/14 0515 06/28/14 0423 07/01/14 0415  WBC 4.2 4.0 5.3 5.8  NEUTROABS  --   --   --  3.2  HGB 12.4* 12.5* 13.4 12.5*  HCT 38.7* 39.4 43.0 39.4  MCV 90.6 92.3 92.9 91.8  PLT 60* 53* 71* 145*   Cardiac Enzymes: No results for input(s): CKTOTAL, CKMB, CKMBINDEX, TROPONINI in the last 168 hours. BNP: BNP (last 3 results) No results for input(s): BNP in the last 8760 hours.  ProBNP (last 3 results) No results for input(s): PROBNP in the last 8760 hours.  CBG:  Recent Labs Lab 06/30/14 0730 06/30/14 1157 06/30/14 1654 06/30/14 2202 07/01/14 0828  GLUCAP 194* 189* 186* 216* 165*       Signed:  Rhetta Mura  [facility] 07/01/2014, 10:43 AM

## 2014-07-01 NOTE — Progress Notes (Signed)
Pt for discharge to [facility].   CSW facilitated pt discharge needs including contacting facility, faxing pt discharge information to facility, speaking with charge RN, Kathlene NovemberMike at [facility] who confirmed that pt could return, Providing unit RN with phone number to call report, [name] states that they will send transportation for pt to return to [facility].   CSW discussed with pt at bedside and pt agreeable to return. CSW provided discharge packet at bedside.   [name] will notify RN when their transport arrives.   No further social work needs identified at this time.  CSW signing off.   Loletta SpecterSuzanna Kidd, MSW, LCSW Clinical Social Work [phone]

## 2016-03-16 IMAGING — US US ABDOMEN COMPLETE
1 series · 13 of 25 positions shown · non-contrast
Comparison: None.

CLINICAL DATA: Transaminitis. Diarrhea. History of alcoholic liver
disease.

EXAM:
ULTRASOUND ABDOMEN COMPLETE

[Series 1: us abdomen complete · 0.21mm/px · 13 of 87 slices shown]
[im 1/87]
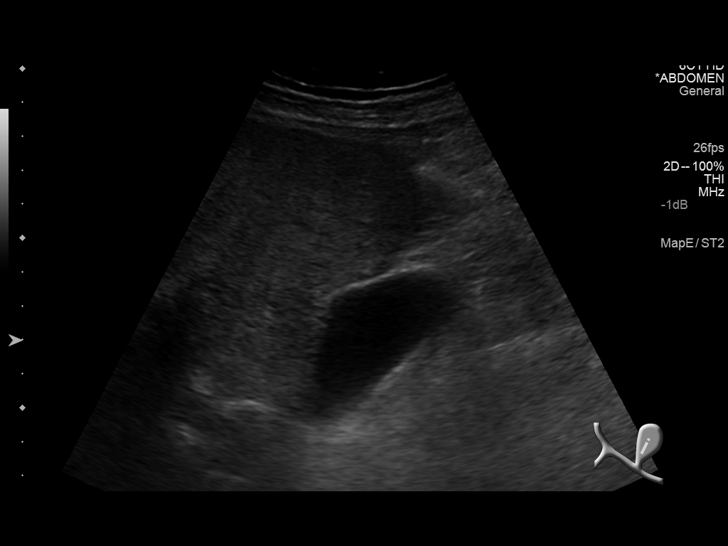
[im 8/87]
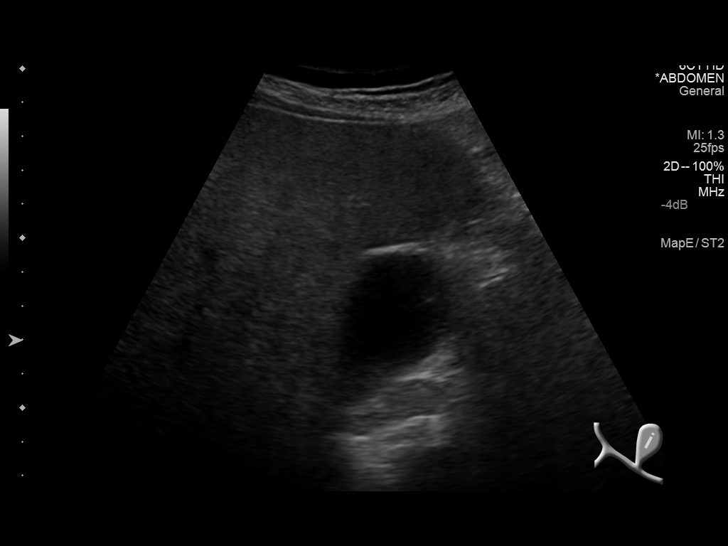
[im 15/87]
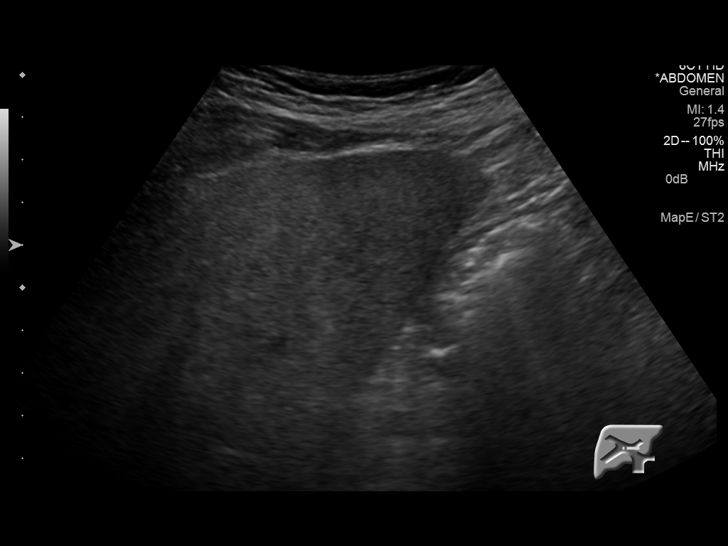
[im 22/87]
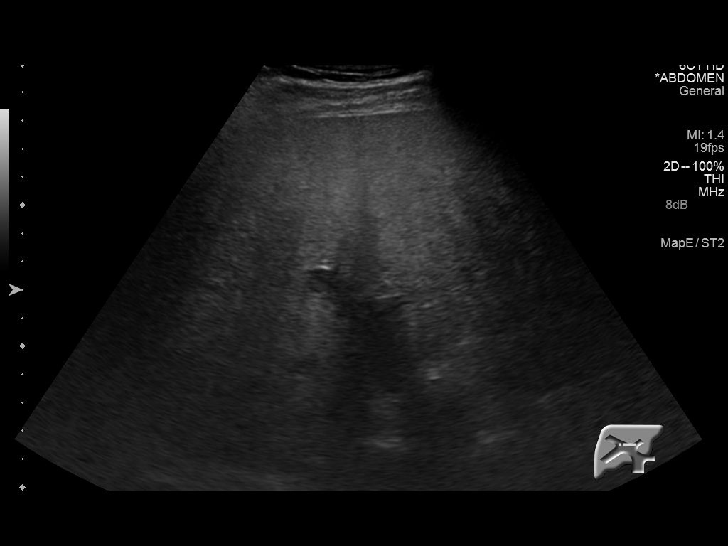
[im 29/87]
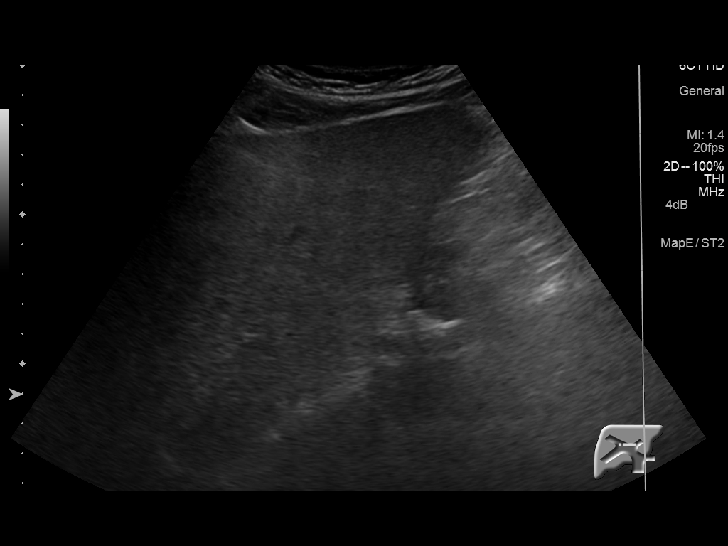
[im 36/87]
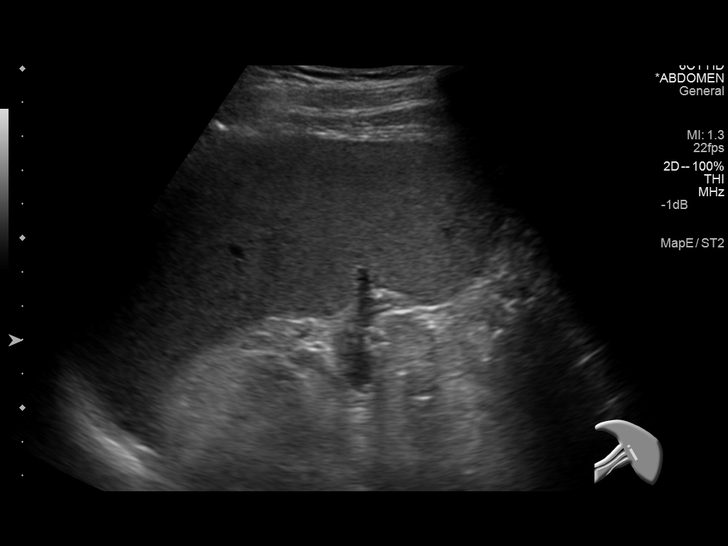
[im 44/87]
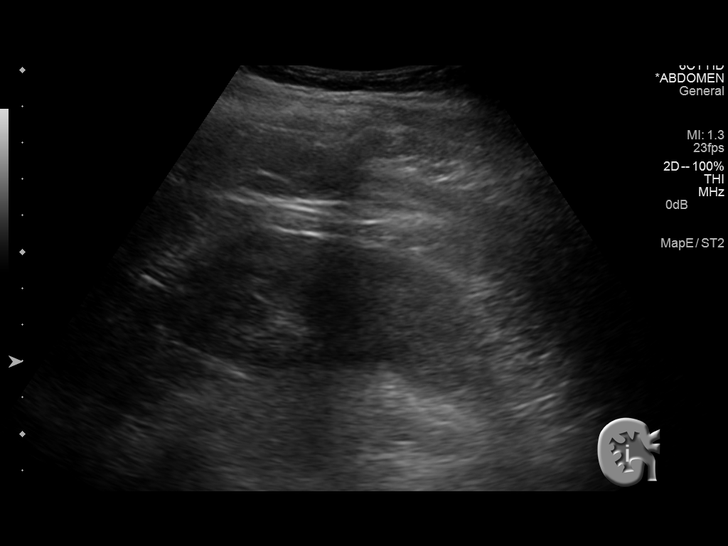
[im 51/87]
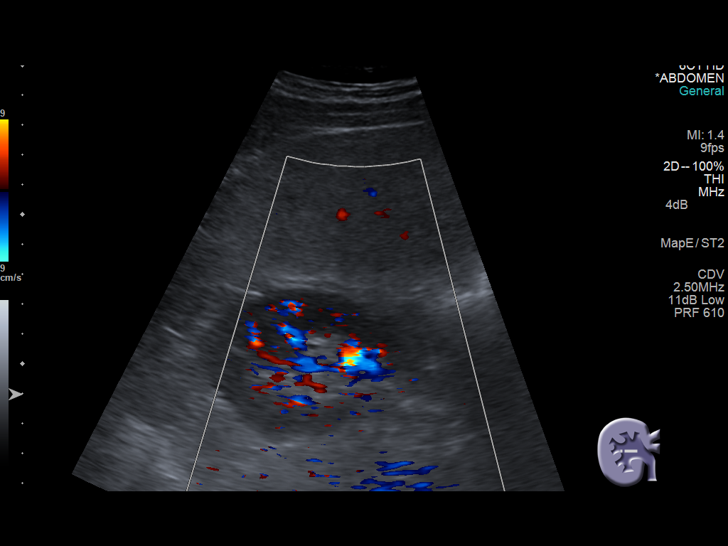
[im 58/87]
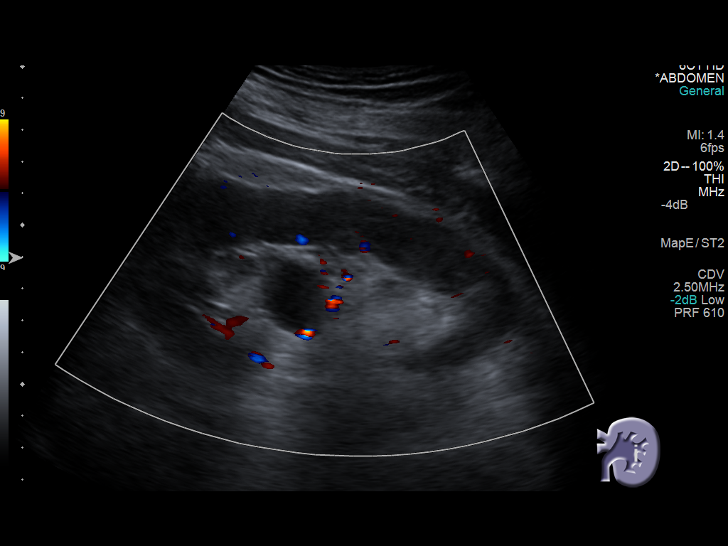
[im 65/87]
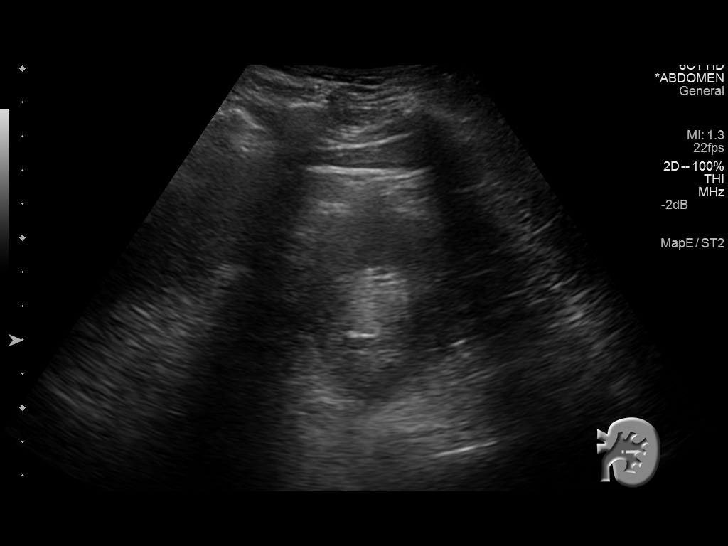
[im 72/87]
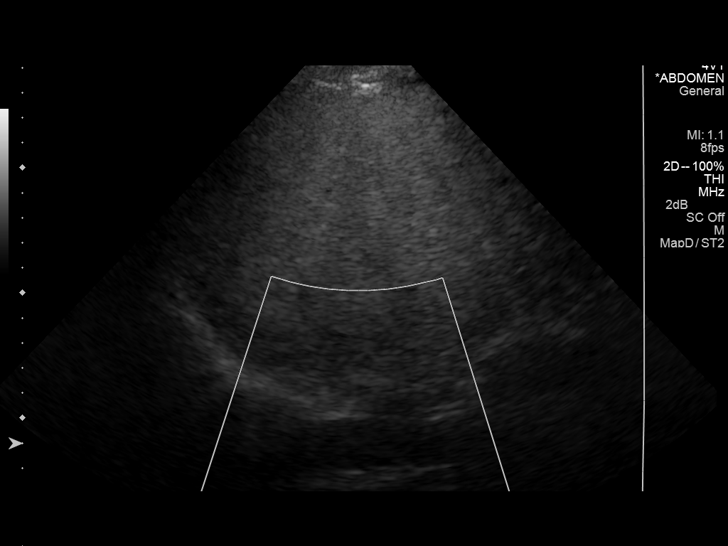
[im 79/87]
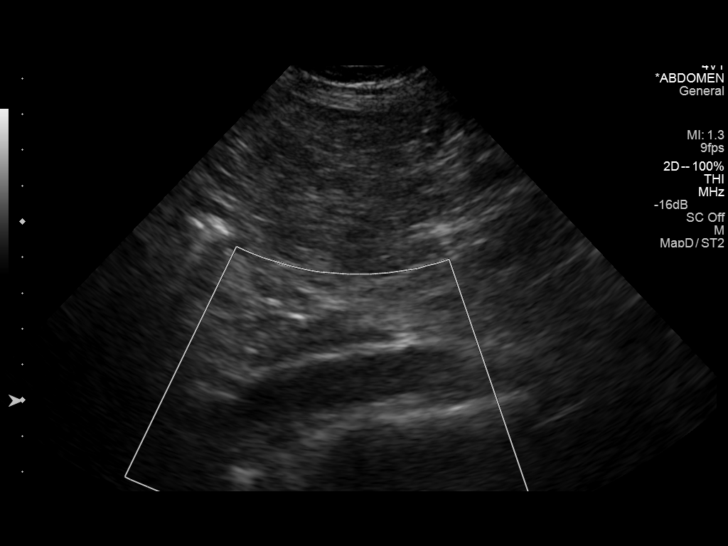
[im 87/87]
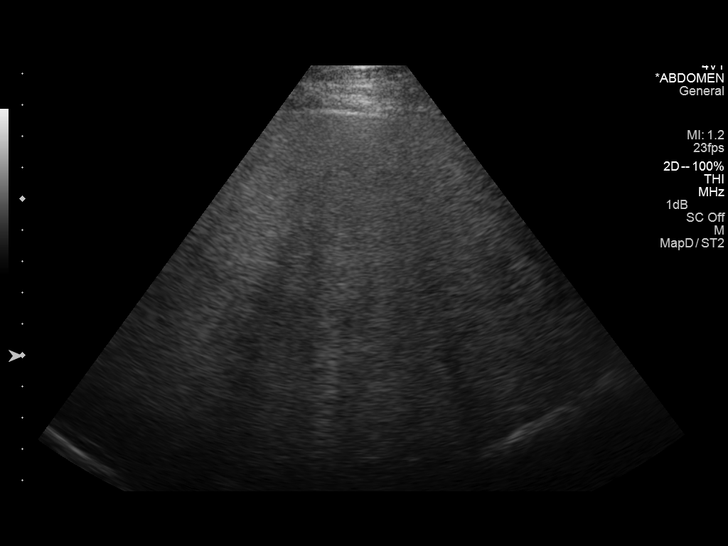

[13 of 25 positions shown; findings below may reference images not displayed]

FINDINGS: Gallbladder: No gallstones or wall thickening visualized. No
sonographic Murphy sign noted.

Common bile duct: Diameter: 3 mm.  Difficult to visualize.

Liver: Appears enlarged. Diffusely increased and coarsened
echotexture raises the possibility of hepatic steatosis. Slight
micronodularity of the liver contour is suspected. The portal vein
is patent is patent and demonstrates normal direction of flow.

IVC: The suprahepatic IVC is visualized in the sagittal projection
and appears within normal limits. The infrahepatic IVC was not well
seen.

Pancreas: Visualized portion unremarkable.

Spleen: Spleen is prominent in size measuring with 14.1 x 14.1 x
cm, with a calculated volume of 882.1 cm cubed.

Right Kidney: Length: 12.0 cm. Echogenicity within normal limits. No
mass or hydronephrosis visualized.

Left Kidney: Length: 12.8 cm.. Echogenicity within normal limits.
Negative for hydronephrosis. 2.3 x 2.2 x 2.3 cm parapelvic cysts
noted.

Abdominal aorta: No aneurysm visualized. Maximum AP diameter is
cm.

Other findings: None.
IMPRESSION: 1. Abnormal coarsened/increased echotexture of the liver raises the
possibility of diffuse fatty infiltration of the liver. Hepatic
cirrhosis cannot be excluded. A subtle micronodular hepatic contour
is suspected.
2. Splenomegaly. If the patient does have cirrhosis, this could be
secondary to portal hypertension.
3. Simple left renal cyst.
